# Patient Record
Sex: Female | Born: 1995 | Race: White | Hispanic: Yes | Marital: Single | Smoking: Never smoker
Health system: Southern US, Community
[De-identification: ages and names within clinical notes are randomized; demographics above are authoritative.]

## PROBLEM LIST (undated history)

## (undated) DIAGNOSIS — Z789 Other specified health status: Secondary | ICD-10-CM

## (undated) HISTORY — DX: Other specified health status: Z78.9

## (undated) HISTORY — PX: NO PAST SURGERIES: SHX2092

---

## 2019-11-04 ENCOUNTER — Encounter (HOSPITAL_BASED_OUTPATIENT_CLINIC_OR_DEPARTMENT_OTHER): Payer: Self-pay | Admitting: Emergency Medicine

## 2019-11-04 ENCOUNTER — Emergency Department (HOSPITAL_BASED_OUTPATIENT_CLINIC_OR_DEPARTMENT_OTHER)
Admission: EM | Admit: 2019-11-04 | Discharge: 2019-11-04 | Disposition: A | Discharge status: Home / Self Care | Payer: Self-pay | Attending: Emergency Medicine | Admitting: Emergency Medicine

## 2019-11-04 ENCOUNTER — Other Ambulatory Visit: Payer: Self-pay

## 2019-11-04 ENCOUNTER — Emergency Department (HOSPITAL_BASED_OUTPATIENT_CLINIC_OR_DEPARTMENT_OTHER): Payer: Self-pay

## 2019-11-04 DIAGNOSIS — R1030 Lower abdominal pain, unspecified: Secondary | ICD-10-CM | POA: Insufficient documentation

## 2019-11-04 DIAGNOSIS — B9689 Other specified bacterial agents as the cause of diseases classified elsewhere: Secondary | ICD-10-CM

## 2019-11-04 DIAGNOSIS — N939 Abnormal uterine and vaginal bleeding, unspecified: Secondary | ICD-10-CM

## 2019-11-04 DIAGNOSIS — Z113 Encounter for screening for infections with a predominantly sexual mode of transmission: Secondary | ICD-10-CM | POA: Insufficient documentation

## 2019-11-04 DIAGNOSIS — B3731 Acute candidiasis of vulva and vagina: Secondary | ICD-10-CM

## 2019-11-04 DIAGNOSIS — Z3A11 11 weeks gestation of pregnancy: Secondary | ICD-10-CM | POA: Insufficient documentation

## 2019-11-04 DIAGNOSIS — O26891 Other specified pregnancy related conditions, first trimester: Secondary | ICD-10-CM

## 2019-11-04 DIAGNOSIS — N3001 Acute cystitis with hematuria: Secondary | ICD-10-CM | POA: Insufficient documentation

## 2019-11-04 DIAGNOSIS — B373 Candidiasis of vulva and vagina: Secondary | ICD-10-CM | POA: Insufficient documentation

## 2019-11-04 DIAGNOSIS — O23591 Infection of other part of genital tract in pregnancy, first trimester: Secondary | ICD-10-CM | POA: Insufficient documentation

## 2019-11-04 LAB — CBC WITH DIFFERENTIAL/PLATELET
Abs Immature Granulocytes: 0.04 10*3/uL (ref 0.00–0.07)
Basophils Absolute: 0 10*3/uL (ref 0.0–0.1)
Basophils Relative: 0 %
Eosinophils Absolute: 0 10*3/uL (ref 0.0–0.5)
Eosinophils Relative: 0 %
HCT: 41.4 % (ref 36.0–46.0)
Hemoglobin: 14.3 g/dL (ref 12.0–15.0)
Immature Granulocytes: 0 %
Lymphocytes Relative: 26 %
Lymphs Abs: 2.5 10*3/uL (ref 0.7–4.0)
MCH: 30.6 pg (ref 26.0–34.0)
MCHC: 34.5 g/dL (ref 30.0–36.0)
MCV: 88.7 fL (ref 80.0–100.0)
Monocytes Absolute: 0.5 10*3/uL (ref 0.1–1.0)
Monocytes Relative: 5 %
Neutro Abs: 6.5 10*3/uL (ref 1.7–7.7)
Neutrophils Relative %: 69 %
Platelets: 345 10*3/uL (ref 150–400)
RBC: 4.67 MIL/uL (ref 3.87–5.11)
RDW: 13.2 % (ref 11.5–15.5)
WBC: 9.6 10*3/uL (ref 4.0–10.5)
nRBC: 0 % (ref 0.0–0.2)

## 2019-11-04 LAB — URINALYSIS, ROUTINE W REFLEX MICROSCOPIC
Bilirubin Urine: NEGATIVE
Glucose, UA: NEGATIVE mg/dL
Ketones, ur: NEGATIVE mg/dL
Nitrite: NEGATIVE
Protein, ur: NEGATIVE mg/dL
Specific Gravity, Urine: 1.025 (ref 1.005–1.030)
pH: 6 (ref 5.0–8.0)

## 2019-11-04 LAB — COMPREHENSIVE METABOLIC PANEL
ALT: 14 U/L (ref 0–44)
AST: 15 U/L (ref 15–41)
Albumin: 4.1 g/dL (ref 3.5–5.0)
Alkaline Phosphatase: 36 U/L — ABNORMAL LOW (ref 38–126)
Anion gap: 9 (ref 5–15)
BUN: 8 mg/dL (ref 6–20)
CO2: 20 mmol/L — ABNORMAL LOW (ref 22–32)
Calcium: 8.9 mg/dL (ref 8.9–10.3)
Chloride: 103 mmol/L (ref 98–111)
Creatinine, Ser: 0.53 mg/dL (ref 0.44–1.00)
GFR calc Af Amer: >60 mL/min (ref >60)
GFR calc non Af Amer: >60 mL/min (ref >60)
Glucose, Bld: 94 mg/dL (ref 70–99)
Potassium: 3.8 mmol/L (ref 3.5–5.1)
Sodium: 132 mmol/L — ABNORMAL LOW (ref 135–145)
Total Bilirubin: 0.2 mg/dL — ABNORMAL LOW (ref 0.3–1.2)
Total Protein: 7.5 g/dL (ref 6.5–8.1)

## 2019-11-04 LAB — URINALYSIS, MICROSCOPIC (REFLEX): WBC, UA: >50 WBC/hpf (ref 0–5)

## 2019-11-04 LAB — HCG, QUANTITATIVE, PREGNANCY: hCG, Beta Chain, Quant, S: 101769 m[IU]/mL — ABNORMAL HIGH (ref <5)

## 2019-11-04 LAB — WET PREP, GENITAL
Sperm: NONE SEEN
Trich, Wet Prep: NONE SEEN

## 2019-11-04 LAB — PREGNANCY, URINE: Preg Test, Ur: POSITIVE — AB

## 2019-11-04 LAB — HIV ANTIBODY (ROUTINE TESTING W REFLEX): HIV Screen 4th Generation wRfx: NONREACTIVE

## 2019-11-04 MED ORDER — SODIUM CHLORIDE 0.9 % IV SOLN
1.0000 g | Freq: Once | INTRAVENOUS | Status: AC
  Administered 2019-11-04: 1 g via INTRAVENOUS
  Filled 2019-11-04: qty 10

## 2019-11-04 MED ORDER — PRENATAL COMPLETE 14-0.4 MG PO TABS: ORAL_TABLET | ORAL | 3 refills | Status: DC

## 2019-11-04 MED ORDER — METRONIDAZOLE 500 MG PO TABS: 500.0000 mg | ORAL_TABLET | Freq: Two times a day (BID) | ORAL | 0 refills | Status: DC

## 2019-11-04 MED ORDER — SODIUM CHLORIDE 0.9 % IV BOLUS
1000.0000 mL | Freq: Once | INTRAVENOUS | Status: AC
  Administered 2019-11-04: 1000 mL via INTRAVENOUS

## 2019-11-04 MED ORDER — CEPHALEXIN 500 MG PO CAPS: 500.0000 mg | ORAL_CAPSULE | Freq: Two times a day (BID) | ORAL | 0 refills | Status: AC

## 2019-11-04 MED ORDER — AZITHROMYCIN 250 MG PO TABS
1000.0000 mg | ORAL_TABLET | Freq: Once | ORAL | Status: AC
  Administered 2019-11-04: 1000 mg via ORAL
  Filled 2019-11-04: qty 4

## 2019-11-04 NOTE — ED Notes (Signed)
Patient transported to Ultrasound 

## 2019-11-04 NOTE — Discharge Instructions (Signed)
Take the Keflex for UTI  Flagyl for Bacterial vaginosis  Obtain Monistat over the counter vaginal suppository for Yeast infection  Follow up with Obgyn

## 2019-11-04 NOTE — ED Triage Notes (Signed)
Low abd pain since Thursday. Hematuria today. Denies vaginal discharge.

## 2019-11-04 NOTE — ED Provider Notes (Signed)
[facility] EMERGENCY DEPARTMENT Provider Note   CSN: [id number] Arrival date & time: 11/04/19  1043    History Chief Complaint  Patient presents with  . Abdominal Pain    Christy Melendez is a 24 y.o. female with past medical history who presents for evaluation of lower pelvic pain and hematuria.  Patient states over the last 3 days she has had lower abdominal cramping.  She is unsure if she is about to start her menstrual cycle as she has not had a period since July.  Patient states it is common for her to go months without a menstrual cycle.  She is sexually active.  Last sexual activity September 12.  Patient states had one episode of hematuria yesterday however none today.  She is unsure if she has had vaginal bleeding.  No vaginal discharge.  She has no concerns for STDs.  No associated flank pain.  No prior history of kidney stones.  Has had some burning with urination over the last 2 days.  Denies fever, chills, nausea, chest pain, shortness of breath.  Denies moderating alleviating factors.  She has had no prior pregnancies.  She is not followed by OB/GYN.  History obtained from patient and past medical records. No interpretor was used.  HPI     History reviewed. No pertinent past medical history.  There are no problems to display for this patient.   History reviewed. No pertinent surgical history.   OB History   No obstetric history on file.     No family history on file.  Social History   Tobacco Use  . Smoking status: Never Smoker  . Smokeless tobacco: Never Used  Substance Use Topics  . Alcohol use: Yes  . Drug use: Never    Home Medications Prior to Admission medications   Medication Sig Start Date End Date Taking? Authorizing Provider  cephALEXin (KEFLEX) 500 MG capsule Take 1 capsule (500 mg total) by mouth 2 (two) times daily for 7 days. 11/04/19 11/11/19  Brylan Dec A, PA-C  metroNIDAZOLE (FLAGYL) 500 MG tablet Take 1 tablet (500 mg  total) by mouth 2 (two) times daily. 11/04/19   Keshawna Dix A, PA-C  Prenatal Vit-Fe Fumarate-FA (PRENATAL COMPLETE) 14-0.4 MG TABS 1 tablet daily 11/04/19   Foy Vanduyne A, PA-C    Allergies    Penicillins  Review of Systems   Review of Systems  Constitutional: Negative.   HENT: Negative.   Respiratory: Negative.   Cardiovascular: Negative.   Gastrointestinal: Positive for abdominal pain. Negative for abdominal distention, anal bleeding, blood in stool, constipation, diarrhea, nausea, rectal pain and vomiting.  Genitourinary: Positive for dysuria, hematuria and menstrual problem. Negative for decreased urine volume, difficulty urinating, flank pain, frequency, pelvic pain, urgency, vaginal discharge and vaginal pain.  Musculoskeletal: Negative.   Skin: Negative.   Neurological: Negative.   All other systems reviewed and are negative.   Physical Exam Updated Vital Signs BP 126/76 (BP Location: Right Arm)   Pulse 80   Temp 98.1 F (36.7 C) (Oral)   Resp 16   Ht 5\' 6"  (1.676 m)   Wt 89.8 kg   LMP 08/09/2019   SpO2 100%   BMI 31.96 kg/m   Physical Exam Vitals and nursing note reviewed.  Constitutional:      General: She is not in acute distress.    Appearance: She is well-developed. She is not ill-appearing, toxic-appearing or diaphoretic.  HENT:     Head: Normocephalic and atraumatic.  Mouth/Throat:     Mouth: Mucous membranes are moist.  Eyes:     Pupils: Pupils are equal, round, and reactive to light.  Cardiovascular:     Rate and Rhythm: Normal rate.     Heart sounds: Normal heart sounds.  Pulmonary:     Effort: Pulmonary effort is normal. No respiratory distress.     Breath sounds: Normal breath sounds.  Abdominal:     General: Bowel sounds are normal. There is no distension.     Palpations: Abdomen is soft.     Tenderness: There is abdominal tenderness in the suprapubic area. There is no right CVA tenderness, left CVA tenderness, guarding or  rebound. Negative signs include Murphy's sign and McBurney's sign.     Hernia: No hernia is present.  Genitourinary:    Comments: Normal appearing external female genitalia without rashes or lesions, normal vaginal epithelium. Normal appearing cervix with copious yellow, green discharge. No cervical petechia. Cervical os is closed. There is no bleeding noted at the os. No odor. Bimanual: No CMT, nontender.  No palpable adnexal masses or tenderness. Uterus midline and not fixed. Rectovaginal exam was deferred.  No cystocele or rectocele noted. No pelvic lymphadenopathy noted. Wet prep was obtained.  Cultures for gonorrhea and chlamydia collected. Exam performed with chaperone in room. Musculoskeletal:        General: Normal range of motion.     Cervical back: Normal range of motion.  Skin:    General: Skin is warm and dry.     Capillary Refill: Capillary refill takes less than 2 seconds.  Neurological:     General: No focal deficit present.     Mental Status: She is alert.     ED Results / Procedures / Treatments   Labs (all labs ordered are listed, but only abnormal results are displayed) Labs Reviewed  WET PREP, GENITAL - Abnormal; Notable for the following components:      Result Value   Yeast Wet Prep HPF POC PRESENT (*)    Clue Cells Wet Prep HPF POC PRESENT (*)    WBC, Wet Prep HPF POC MANY (*)    All other components within normal limits  URINALYSIS, ROUTINE W REFLEX MICROSCOPIC - Abnormal; Notable for the following components:   APPearance CLOUDY (*)    Hgb urine dipstick TRACE (*)    Leukocytes,Ua LARGE (*)    All other components within normal limits  PREGNANCY, URINE - Abnormal; Notable for the following components:   Preg Test, Ur POSITIVE (*)    All other components within normal limits  URINALYSIS, MICROSCOPIC (REFLEX) - Abnormal; Notable for the following components:   Bacteria, UA MANY (*)    All other components within normal limits  COMPREHENSIVE METABOLIC PANEL  - Abnormal; Notable for the following components:   Sodium 132 (*)    CO2 20 (*)    Alkaline Phosphatase 36 (*)    Total Bilirubin 0.2 (*)    All other components within normal limits  HCG, QUANTITATIVE, PREGNANCY - Abnormal; Notable for the following components:   hCG, Beta Chain, Quant, S 101,769 (*)    All other components within normal limits  URINE CULTURE  CBC WITH DIFFERENTIAL/PLATELET  RPR  HIV ANTIBODY (ROUTINE TESTING W REFLEX)  ABO/RH  GC/CHLAMYDIA PROBE AMP ([place]) NOT AT [facility]    EKG None  Radiology US OB LESS THAN 14 WEEKS WITH OB TRANSVAGINAL  Result Date: 11/04/2019 CLINICAL DATA:  Vaginal bleeding. EXAM: OBSTETRIC <14 WK [place] AND  TRANSVAGINAL OB US TECHNIQUE: Both transabdominal and transvaginal ultrasound examinations were performed for complete evaluation of the gestation as well as the maternal uterus, adnexal regions, and pelvic cul-de-sac. Transvaginal technique was performed to assess early pregnancy. COMPARISON:  None. FINDINGS: Intrauterine gestational sac: Single Yolk sac:  Not Visualized. Embryo:  Visualized. Cardiac Activity: Visualized. Heart Rate: 160  bpm CRL: 50.9  mm   11 w   6 d                  [place] EDC: 05/19/2020 Subchorionic hemorrhage:  None visualized. Maternal uterus/adnexae: Normal appearance. Probable corpus luteum in left ovary. No free fluid. IMPRESSION: Single viable intrauterine pregnancy. No acute abnormality identified in the pelvis. Electronically Signed   By: Emmaline Kluver M.D.   On: 11/04/2019 14:37    Procedures Procedures (including critical care time)  Medications Ordered in ED Medications  sodium chloride 0.9 % bolus 1,000 mL (0 mLs Intravenous Stopped 11/04/19 1433)  cefTRIAXone (ROCEPHIN) 1 g in sodium chloride 0.9 % 100 mL IVPB (0 g Intravenous Stopped 11/04/19 1400)  azithromycin (ZITHROMAX) tablet 1,000 mg (1,000 mg Oral Given 11/04/19 1525)    ED Course  I have reviewed the triage vital signs and the nursing  notes.  Pertinent labs & imaging results that were available during my care of the patient were reviewed by me and considered in my medical decision making (see chart for details).  24 year old presents for evaluation of suprapubic pain and hematuria.  She is afebrile, nonseptic, non-ill-appearing.  Hematuria x1 yesterday, none today.  Suprapubic pain over the last 3 days.  LMP in July.  History of abnormal menstrual cycles.  She is sexually active.  Denies vaginal discharge or concerns for STDs.  She has had no prior pregnancies.  She has not followed OB/GYN.  Has has had no dysuria.  Urine pregnancy test here positive Urinalysis consistent with UTI Plan on labs, imaging, pelvic and reassess.  [place] IUP at 11 weeks 6 days. hCG quant [id number] CBC without leukocytosis Metabolic panel with mild hyponatremia at 132, CO2 20, no additional electrolyte, renal or liver abnormality  Rh a positive-does not need RhoGam Wet prep with yeast, BV and many WBC  She is GU exam consistent with STD given green discharge.  Was treated empirically with Rocephin and azithromycin.  Was given prescription for her UTI.  Referral was placed for OB/GYN.  She did not have any bleeding from her cervix on her pelvic exam.  Her cervix was closed.  Discussed regarding her precautions.  Patient voiced understanding is agreeable for follow-up.  Patient does not meet the SIRS or Sepsis criteria.  On repeat exam patient does not have a surgical abdomin and there are no peritoneal signs.  No indication of appendicitis, bowel obstruction, bowel perforation, cholecystitis, renal stone, diverticulitis, PID, TOA or ectopic pregnancy.   Patient has not needed anything for pain while who she was here in the emergency department.  She is tolerating p.o. intake without difficulty.  The patient has been appropriately medically screened and/or stabilized in the ED. I have low suspicion for any other emergent medical condition which would  require further screening, evaluation or treatment in the ED or require inpatient management.  Patient is hemodynamically stable and in no acute distress.  Patient able to ambulate in department prior to ED.  Evaluation does not show acute pathology that would require ongoing or additional emergent interventions while in the emergency department or further inpatient treatment.  I have  discussed the diagnosis with the patient and answered all questions.  Pain is been managed while in the emergency department and patient has no further complaints prior to discharge.  Patient is comfortable with plan discussed in room and is stable for discharge at this time.  I have discussed strict return precautions for returning to the emergency department.  Patient was encouraged to follow-up with PCP/specialist refer to at discharge.     MDM Rules/Calculators/A&P                           Final Clinical Impression(s) / ED Diagnoses Final diagnoses:  Abdominal pain during pregnancy in first trimester  [redacted] weeks gestation of pregnancy  Acute cystitis with hematuria  Yeast vaginitis  Bacterial vaginosis in pregnancy  Screening for STDs (sexually transmitted diseases)    Rx / DC Orders ED Discharge Orders         Ordered    cephALEXin (KEFLEX) 500 MG capsule  2 times daily        11/04/19 1517    metroNIDAZOLE (FLAGYL) 500 MG tablet  2 times daily        11/04/19 1517    Prenatal Vit-Fe Fumarate-FA (PRENATAL COMPLETE) 14-0.4 MG TABS        11/04/19 1517    Ambulatory referral to Obstetrics / Gynecology        11/04/19 1519           Rielly Brunn A, PA-C 11/04/19 1603    Cathren LaineSteinl, Kevin, MD 11/07/19 1514

## 2019-11-05 LAB — URINE CULTURE

## 2019-11-05 LAB — ABO/RH: ABO/RH(D): A POS

## 2019-11-05 LAB — GC/CHLAMYDIA PROBE AMP (~~LOC~~) NOT AT ARMC
Chlamydia: POSITIVE — AB
Comment: NEGATIVE
Comment: NORMAL
Neisseria Gonorrhea: NEGATIVE

## 2019-11-05 LAB — RPR: RPR Ser Ql: NONREACTIVE

## 2019-11-13 ENCOUNTER — Other Ambulatory Visit: Payer: Self-pay

## 2019-11-13 ENCOUNTER — Encounter: Payer: Self-pay | Admitting: Advanced Practice Midwife

## 2019-11-13 ENCOUNTER — Other Ambulatory Visit (HOSPITAL_COMMUNITY)
Admission: RE | Admit: 2019-11-13 | Discharge: 2019-11-13 | Disposition: A | Discharge status: Home / Self Care | Payer: Self-pay | Source: Ambulatory Visit | Attending: Advanced Practice Midwife | Admitting: Advanced Practice Midwife

## 2019-11-13 ENCOUNTER — Ambulatory Visit (INDEPENDENT_AMBULATORY_CARE_PROVIDER_SITE_OTHER): Payer: Self-pay | Admitting: Advanced Practice Midwife

## 2019-11-13 VITALS — BP 124/85 | HR 105 | Wt 202.2 lb

## 2019-11-13 DIAGNOSIS — Z3480 Encounter for supervision of other normal pregnancy, unspecified trimester: Secondary | ICD-10-CM | POA: Insufficient documentation

## 2019-11-13 DIAGNOSIS — Z3A13 13 weeks gestation of pregnancy: Secondary | ICD-10-CM

## 2019-11-13 MED ORDER — AZITHROMYCIN 250 MG PO TABS: 1000.0000 mg | ORAL_TABLET | Freq: Once | ORAL | 0 refills | Status: AC

## 2019-11-13 NOTE — Progress Notes (Signed)
Need Tx for Chlamydia from 10/3

## 2019-11-13 NOTE — Progress Notes (Signed)
Initial Prenatal Visit Note    Subjective:   Christy Melendez is a 24 y.o. G1P0 at [redacted]w[redacted]d by LMP being seen today for her first obstetrical visit.  Her obstetrical history is significant for none . Patient does intend to breast feed. Pregnancy history fully reviewed.  Patient reports no complaints.  HISTORY: OB History  Gravida Para Term Preterm AB Living  1 0 0 0 0 0  SAB TAB Ectopic Multiple Live Births  0 0 0 0 0    # Outcome Date GA Lbr Len/2nd Weight Sex Delivery Anes PTL Lv  1 Current             Last pap smear was done unsure and was unknown   History reviewed. No pertinent past medical history. History reviewed. No pertinent surgical history. History reviewed. No pertinent family history. Social History   Tobacco Use  . Smoking status: Never Smoker  . Smokeless tobacco: Never Used  Substance Use Topics  . Alcohol use: Never  . Drug use: Never   Allergies  Allergen Reactions  . Penicillins    Current Outpatient Medications on File Prior to Visit  Medication Sig Dispense Refill  . Prenatal Vit-Fe Fumarate-FA (PRENATAL COMPLETE) 14-0.4 MG TABS 1 tablet daily 60 tablet 3  . metroNIDAZOLE (FLAGYL) 500 MG tablet Take 1 tablet (500 mg total) by mouth 2 (two) times daily. (Patient not taking: Reported on 11/13/2019) 14 tablet 0   No current facility-administered medications on file prior to visit.    Review of Systems Pertinent items noted in HPI and remainder of comprehensive ROS otherwise negative.  Exam   Vitals:   11/13/19 1419  BP: 124/85  Pulse: (!) 105  Weight: 202 lb 3.2 oz (91.7 kg)   Fetal Heart Rate (bpm): 147  Physical Exam Vitals and nursing note reviewed. Exam conducted with a chaperone present.  Constitutional:      General: She is not in acute distress. HENT:     Head: Normocephalic.  Cardiovascular:     Rate and Rhythm: Normal rate.  Pulmonary:     Effort: Pulmonary effort is normal.  Abdominal:     Palpations: Abdomen is soft.      Tenderness: There is no abdominal tenderness.  Genitourinary:    Comments:  External: no lesion Vagina: small amount of white discharge Cervix: pink, smooth, no CMT Uterus: 13 weeks size   Skin:    General: Skin is warm and dry.  Neurological:     Mental Status: She is alert and oriented to person, place, and time.  Psychiatric:        Mood and Affect: Mood normal.        Behavior: Behavior normal.      Assessment:   Pregnancy: G1P0 Patient Active Problem List   Diagnosis Date Noted  . Supervision of other normal pregnancy, antepartum 11/13/2019     Plan:  1. Supervision of other normal pregnancy, antepartum - [place] MFM OB DETAIL +14 WK; Future - Culture, OB Urine - Genetic Screening - CBC/D/Plt+RPR+Rh+ABO+Rub Ab... - Cytology - PAP( [facility])  2. [redacted] weeks gestation of pregnancy - patient had +chlamydia on 11/04/2019. She has not gotten treated yet. RX sent to pharmacy. Discussed partner treatment.    Initial labs drawn. Continue prenatal vitamins. Genetic Screening discussed, AFP and NIPS: requested. Ultrasound discussed; fetal anatomic survey: ordered. Problem list reviewed and updated. The nature of [facility] with multiple MDs and other Advanced Practice Providers was explained  to patient; also emphasized that residents, students are part of our team. Routine obstetric precautions reviewed. 50% of 45 min visit spent in counseling and coordination of care. No follow-ups on file.   Thressa Sheller DNP, CNM  11/13/19  2:54 PM

## 2019-11-14 LAB — CBC/D/PLT+RPR+RH+ABO+RUB AB...
Antibody Screen: NEGATIVE
Basophils Absolute: 0 10*3/uL (ref 0.0–0.2)
Basos: 0 %
EOS (ABSOLUTE): 0.1 10*3/uL (ref 0.0–0.4)
Eos: 1 %
HCV Ab: <0.1 s/co ratio (ref 0.0–0.9)
HIV Screen 4th Generation wRfx: NONREACTIVE
Hematocrit: 40.4 % (ref 34.0–46.6)
Hemoglobin: 13.8 g/dL (ref 11.1–15.9)
Hepatitis B Surface Ag: NEGATIVE
Immature Grans (Abs): 0 10*3/uL (ref 0.0–0.1)
Immature Granulocytes: 0 %
Lymphocytes Absolute: 2.7 10*3/uL (ref 0.7–3.1)
Lymphs: 28 %
MCH: 30.7 pg (ref 26.6–33.0)
MCHC: 34.2 g/dL (ref 31.5–35.7)
MCV: 90 fL (ref 79–97)
Monocytes Absolute: 0.5 10*3/uL (ref 0.1–0.9)
Monocytes: 5 %
Neutrophils Absolute: 6.3 10*3/uL (ref 1.4–7.0)
Neutrophils: 66 %
Platelets: 348 10*3/uL (ref 150–450)
RBC: 4.49 x10E6/uL (ref 3.77–5.28)
RDW: 12.6 % (ref 11.7–15.4)
RPR Ser Ql: NONREACTIVE
Rh Factor: POSITIVE
Rubella Antibodies, IGG: 6.56 index (ref >0.99)
WBC: 9.6 10*3/uL (ref 3.4–10.8)

## 2019-11-14 LAB — HCV INTERPRETATION

## 2019-11-15 LAB — CYTOLOGY - PAP
Comment: NEGATIVE
Diagnosis: NEGATIVE
Diagnosis: REACTIVE
High risk HPV: NEGATIVE

## 2019-11-19 LAB — URINE CULTURE, OB REFLEX

## 2019-11-19 LAB — CULTURE, OB URINE

## 2019-11-22 ENCOUNTER — Encounter: Payer: Self-pay | Admitting: General Practice

## 2019-11-22 MED ORDER — CEPHALEXIN 500 MG PO CAPS: 500.0000 mg | ORAL_CAPSULE | Freq: Two times a day (BID) | ORAL | 0 refills | Status: AC

## 2019-11-22 NOTE — Addendum Note (Signed)
Addended by: Thressa Sheller D on: 11/22/2019 09:17 AM   Modules accepted: Orders

## 2019-12-11 ENCOUNTER — Encounter: Payer: Self-pay | Admitting: Advanced Practice Midwife

## 2019-12-18 ENCOUNTER — Encounter: Payer: Self-pay | Admitting: Advanced Practice Midwife

## 2019-12-18 ENCOUNTER — Ambulatory Visit (INDEPENDENT_AMBULATORY_CARE_PROVIDER_SITE_OTHER): Payer: Self-pay | Admitting: Advanced Practice Midwife

## 2019-12-18 ENCOUNTER — Other Ambulatory Visit: Payer: Self-pay

## 2019-12-18 VITALS — BP 124/82 | HR 89 | Wt 200.3 lb

## 2019-12-18 DIAGNOSIS — Z3480 Encounter for supervision of other normal pregnancy, unspecified trimester: Secondary | ICD-10-CM

## 2019-12-18 DIAGNOSIS — Z3A18 18 weeks gestation of pregnancy: Secondary | ICD-10-CM

## 2019-12-18 NOTE — Patient Instructions (Signed)
COVID-19 Vaccination if You Are Pregnant or Breastfeeding ° °The Society for Maternal-Fetal Medicine (SMFM) and other pregnancy experts recommend that pregnant and lactating people be vaccinated against COVID-19. The Centers for Disease Control and Prevention (CDC) also recommend vaccination for “all people aged 26 years and older, including people who are pregnant, breastfeeding, trying to get pregnant now, or might become pregnant in the future.” Vaccination is the best way to reduce the risks of COVID-19 infection and COVID-related complications for both you and your baby. ° °Three vaccines are available to prevent COVID-19: °• The two-dose Pfizer vaccine for people 12 years and older--APPROVED by the [place] Food and Drug Administration on September 24, 2019 °• The two-dose Moderna vaccine for people 18 years and older--AUTHORIZED for emergency use °• The one-dose Johnson & Johnson vaccine for people 18 years and older (you may also see this vaccine referred to as the “Janssen vaccine”)--AUTHORIZED for emergency use ° °For those receiving the Pfizer and Moderna vaccines, the second dose is given 21 days (Pfizer) and 28 days (Moderna) after the first dose. The Johnson & Johnson vaccine is only one dose. ° °Information for Pregnant Individuals °If you are pregnant or planning to become pregnant and are thinking about getting vaccinated, consider talking with your health care professional about the vaccine.  ° °To help with your decision, you should consider the following key points: °Anyone can get the COVID vaccines free of charge regardless of immigration status or whether they have insurance. You may be asked for your social security number, but it is  °NOT required to get vaccinated. ° °What are benefits of getting the COVID-19 vaccines during pregnancy?  °• The vaccines can help protect you from getting COVID-19. With the two-dose vaccines, you must get both doses for maximum effectiveness. It’s not yet known how  long protection lasts. ° °• Another potential benefit is that getting the vaccine while pregnant may help you pass antiCOVID-19 antibodies to your baby. In numerous studies of vaccinated moms, antibodies were found in the umbilical cord blood of babies and in the mother’s breastmilk. ° °• The CDC, along with other federal partners, are monitoring people who have been vaccinated for serious side effects. So far, more than 139,000 pregnant people have been °vaccinated. No unexpected pregnancy or fetal problems have occurred. There have been no reports of any increased risk of pregnancy loss, growth problems, or birth defects. ° °• A safe vaccine is generally considered one in which the benefits of being vaccinated outweigh the risks. The current vaccines are not live vaccines. There is only a very small  °chance that they cross the placenta, so it’s unlikely that they even reach the fetus. Vaccines don’t affect future fertility. The only people who should NOT get vaccinated are those who have had a severe allergic reaction to vaccines in the past or any vaccine ingredients. ° °• Side effects may occur in the first 3 days after getting vaccinated.1 These include mild to moderate fever, headache, and muscle aches. Side effects may be worse after the second dose of the Pfizer and Moderna vaccines. Fever should be avoided during pregnancy,especially in the first trimester. Those who develop a fever after vaccination can take °acetaminophen (Tylenol). This medication is safe to use during pregnancy and does not  affect how the vaccine works.  ° °What are the known risks of getting COVID-19 during pregnancy?  °About 1 to 3 per 1,000 pregnant women with COVID-19 will develop severe disease. Compared with those who   aren’t pregnant, pregnant people infected by the COVID-19 virus: °• Are 3 times more likely to need ICU care °• Are 2 to 3 times more likely to need advanced life support and a breathing tube  °• Have a small  increased risk of dying due to COVID-19 °They may also be at increased risk of stillbirth and preterm birth. ° °What is my risk of getting COVID-19?  °Your risk of getting COVID-19 depends on the chance that you will come into contact with another infected person. The risk may be higher if you live in a community where there is a lot of COVID-19 infection or work in healthcare or another high-contact setting.  ° °What is my risk for severe complications if I get COVID-19?  °Data show that older pregnant women; those with preexisting health conditions, such as a body mass index higher than 35 kg/m2, diabetes, and heart disorders; and Black or Latinx women have an especially increased risk of severe disease and death from COVID-19. °  °If you still have questions about the vaccines or need more information, ask your health care provider or go to the Centers for Disease Control and Prevention’s COVID-19 vaccine webpage.  ° °An Update on the Johnson & Johnson Vaccine  ° °In April 2021, the FDA and CDC called for a brief pause to use of the Johnson & Johnson vaccine. They did so after reports of a severe side effect in a very small number of women younger than age 50 following vaccination. This side effect, called thrombosis with thrombocytopenia syndrome (TTS), causes blood clots (thrombosis) combined with low levels of platelets (thrombocytopenia). ° °TTS following the Johnson & Johnson vaccine is extremely rare. At the time of this update, it has occurred in only 7 people per 1 million Johnson & Johnson shots given. According to the CDC, being on hormonal birth control (the pill, patch, or ring), pregnancy, breastfeeding, or being recently pregnant does not make you more likely to develop TTS after getting the Johnson & Johnson vaccine. The pause was lifted on May 25, 2019, after the FDA and CDC determined that the known benefits of the Johnson & Johnson vaccine far outweigh the risks.  ° °Health care professionals  have been alerted to the possibility of this side effect in people who have received the Johnson & Johnson vaccine. National organizations continue to recommend COVID-19 vaccination with any of the vaccines for pregnant women. All women younger than age 50 years, whether pregnant, breastfeeding, or not, should be aware of the very rare risk of TTS after getting the Johnson & Johnson vaccine. The Pfizer and Moderna vaccines don’t have this risk. If you get the Johnson & Johnson vaccine,  °seek medical help right away if you develop any of the following symptoms within 3 weeks of getting your shot: ° °• Severe or persistent headaches or blurred vision °• Shortness of breath °• Chest pain °• Leg swelling °• Persistent abdominal pain °• Easy bruising or tiny blood spots under the skin beyond the injection site ° °Experts continue to collect health and safety information from pregnant people who have been vaccinated. If you have questions about vaccination during pregnancy, visit the CDC website or talk to your health care professional. Information for Breastfeeding/Lactating Individuals The Society for Maternal-Fetal Medicine and other pregnancy experts recommend COVID-19 vaccination for people who are breastfeeding/lactating. You don’t have to delay or stop  °breastfeeding just because you get vaccinated.  ° °Getting Vaccinated  °You can get vaccinated at   any time during pregnancy. The CDC is committed to monitoring the vaccine’s safety for all individuals. Your health professional or vaccine clinic may give you information about enrolling in the v-safe after vaccination health checker (see the box below).Even after you’re fully vaccinated, it is important to follow the CDC’s guidance for wearing a mask indoors in areas where there are substantial or high rates of COVID-19 infection.  ° °What Happens When You Enroll in v-Safe?  °The v-safe after vaccination health checker program lets the CDC check in with you after  your vaccination. At sign-up, you can indicate that you are pregnant. Once you do that, expect the following: °• Someone may call you from the v-safe program to ask initial questions and get more information. °• You may be asked to enroll in the vaccine pregnancy registry, which is collecting information about any effects of the vaccine during pregnancy. This is a great way to help scientists monitor the vaccine’s safety and effectiveness.  ° °References °1. Oliver SE, Gargano JW, Marin M, Wallace M, Curran KG, Chamberland M, et al. The Advisory Committee on Immunization Practices’ Interim Recommendation for Use of Pfizer-BioNTech  °COVID-19 Vaccine -- [place], December 2020. MMWR Morbidity and Mortality Weekly Report 2020;69. °2. FDA Briefing Document. Janssen Ad26.COV2.S Vaccine for the Prevention of COVID-19. 2021. Accessed Apr 06, 2019; Available from: https://www.fda.gov/media/146217/download °3. PFIZER-BIONTECH COVID-19 VACCINE [package insert] [place]: Pfizer and [place], [place]: Biontech;2020. °4. FDA Briefing Document. Moderna COVID-19 Vaccine. 2020. Accessed 2020, Dec 18; Available from: https://www.fda.gov/media/144434/download  °5. Gray KJ, Bordt EA, Atyeo C, Deriso E, Akinwunmi B, Young N, et al. COVID-19 vaccine response in pregnant and lactating women: a cohort study. Am J Obstet Gynecol 2021 Mar 24. °6. Panagiotakopoulos L, Myers TR, Gee J, Lipkind HS, Kharbanda EO, Ryan DS, et al. SARS-CoV-2 Infection Among Hospitalized Pregnant Women: Reasons for Admission and Pregnancy  °Characteristics - [facility], March 1-Jul 01, 2018. MMWR Morb Mortal Wkly Rep 2020 Sep 23;69(38):1355-9. °7. Zambrano LD, Ellington S, Strid P, Galang RR, Oduyebo T, Tong VT, et al. Update: Characteristics of Symptomatic Women of Reproductive Age with Laboratory-Confirmed SARSCoV-2 Infection by Pregnancy Status - [place], January 22-November 04, 2018. MMWR Morb Mortal Wkly Rep 2020 Nov  6;69(44):1641-7. °8. Delahoy MJ, Whitaker M, O'Halloran A, Chai SJ, Kirley PD, Alden N, et al. Characteristics and Maternal and Birth Outcomes of Hospitalized Pregnant Women with Laboratory-Confirmed COVID-19 - COVID-NET, 13 [place], March 1-September 23, 2018. MMWR Morb Mortal Wkly Rep 2020 Sep 25;69(38):1347-54. ° °

## 2019-12-18 NOTE — Progress Notes (Signed)
   PRENATAL VISIT NOTE  Subjective:  Christy Melendez is a 24 y.o. G1P0 at [redacted]w[redacted]d being seen today for ongoing prenatal care.  She is currently monitored for the following issues for this low-risk pregnancy and has Supervision of other normal pregnancy, antepartum on their problem list.  Patient reports no complaints.  Contractions: Not present. Vag. Bleeding: None.  Movement: Absent. Denies leaking of fluid.   The following portions of the patient's history were reviewed and updated as appropriate: allergies, current medications, past family history, past medical history, past social history, past surgical history and problem list.   Objective:   Vitals:   12/18/19 1008  BP: 124/82  Pulse: 89  Weight: 200 lb 4.8 oz (90.9 kg)    Fetal Status: Fetal Heart Rate (bpm): 154   Movement: Absent     General:  Alert, oriented and cooperative. Patient is in no acute distress.  Skin: Skin is warm and dry. No rash noted.   Cardiovascular: Normal heart rate noted  Respiratory: Normal respiratory effort, no problems with respiration noted  Abdomen: Soft, gravid, appropriate for gestational age.  Pain/Pressure: Present     Pelvic: Cervical exam deferred        Extremities: Normal range of motion.  Edema: Trace  Mental Status: Normal mood and affect. Normal behavior. Normal judgment and thought content.   Assessment and Plan:  Pregnancy: G1P0 at [redacted]w[redacted]d 1. Supervision of other normal pregnancy, antepartum - Patient had +Chamydia on 10/3. She did not end up getting treatment until a few days ago due to being out of town. (but it also looks like she possibly had empiric treatment in the ED) Patient had already left when I saw that and I did not get to ask her, but either way she needs a TOC at her next visit  - Will need TOC at next visit   2. [redacted] weeks gestation of pregnancy - AFP, Serum, Open Spina Bifida  Preterm labor symptoms and general obstetric precautions including but not limited to  vaginal bleeding, contractions, leaking of fluid and fetal movement were reviewed in detail with the patient. Please refer to After Visit Summary for other counseling recommendations.   No follow-ups on file.  Future Appointments  Date Time Provider Department Center  12/24/2019 10:45 AM [facility] US5 [facility]   Thressa Sheller DNP, CNM  12/18/19  11:12 AM

## 2019-12-20 LAB — AFP, SERUM, OPEN SPINA BIFIDA
AFP MoM: 1.27
AFP Value: 49.6 ng/mL
Gest. Age on Collection Date: 18.5 weeks
Maternal Age At EDD: 26.9 yr
OSBR Risk 1 IN: 5251
Test Results:: NEGATIVE
Weight: 200 [lb_av]

## 2019-12-24 ENCOUNTER — Other Ambulatory Visit: Payer: Self-pay

## 2019-12-24 ENCOUNTER — Other Ambulatory Visit: Payer: Self-pay | Admitting: *Deleted

## 2019-12-24 ENCOUNTER — Other Ambulatory Visit: Payer: Self-pay | Admitting: Advanced Practice Midwife

## 2019-12-24 ENCOUNTER — Ambulatory Visit: Payer: Self-pay | Attending: Advanced Practice Midwife

## 2019-12-24 DIAGNOSIS — Z3480 Encounter for supervision of other normal pregnancy, unspecified trimester: Secondary | ICD-10-CM | POA: Insufficient documentation

## 2019-12-24 DIAGNOSIS — Z362 Encounter for other antenatal screening follow-up: Secondary | ICD-10-CM

## 2020-01-16 ENCOUNTER — Ambulatory Visit (INDEPENDENT_AMBULATORY_CARE_PROVIDER_SITE_OTHER): Payer: Self-pay | Admitting: Student

## 2020-01-16 ENCOUNTER — Other Ambulatory Visit (HOSPITAL_COMMUNITY)
Admission: RE | Admit: 2020-01-16 | Discharge: 2020-01-16 | Disposition: A | Discharge status: Home / Self Care | Payer: Self-pay | Source: Ambulatory Visit | Attending: Student | Admitting: Student

## 2020-01-16 ENCOUNTER — Other Ambulatory Visit: Payer: Self-pay

## 2020-01-16 VITALS — BP 111/69 | HR 103 | Wt 197.9 lb

## 2020-01-16 DIAGNOSIS — O98819 Other maternal infectious and parasitic diseases complicating pregnancy, unspecified trimester: Secondary | ICD-10-CM | POA: Insufficient documentation

## 2020-01-16 DIAGNOSIS — B951 Streptococcus, group B, as the cause of diseases classified elsewhere: Secondary | ICD-10-CM | POA: Insufficient documentation

## 2020-01-16 DIAGNOSIS — Z3A22 22 weeks gestation of pregnancy: Secondary | ICD-10-CM

## 2020-01-16 DIAGNOSIS — O43199 Other malformation of placenta, unspecified trimester: Secondary | ICD-10-CM

## 2020-01-16 DIAGNOSIS — A749 Chlamydial infection, unspecified: Secondary | ICD-10-CM | POA: Insufficient documentation

## 2020-01-16 DIAGNOSIS — Z3480 Encounter for supervision of other normal pregnancy, unspecified trimester: Secondary | ICD-10-CM

## 2020-01-16 LAB — OB RESULTS CONSOLE GC/CHLAMYDIA: Gonorrhea: NEGATIVE

## 2020-01-16 MED ORDER — PRENATAL PLUS 27-1 MG PO TABS: 1.0000 | ORAL_TABLET | Freq: Every day | ORAL | 11 refills | Status: AC

## 2020-01-16 NOTE — Progress Notes (Signed)
   PRENATAL VISIT NOTE  Subjective:  Christy Melendez is a 24 y.o. G1P0 at [redacted]w[redacted]d being seen today for ongoing prenatal care.  She is currently monitored for the following issues for this low-risk pregnancy and has Supervision of other normal pregnancy, antepartum; Group beta Strep positive; Chlamydia; and Marginal insertion of umbilical cord affecting management of mother on their problem list.  Patient reports no complaints. She has questions about her [place] and what marginal cord insertion means.  Contractions: Not present. Vag. Bleeding: None.  Movement: Absent. Denies leaking of fluid.   The following portions of the patient's history were reviewed and updated as appropriate: allergies, current medications, past family history, past medical history, past social history, past surgical history and problem list.   Objective:   Vitals:   01/16/20 1017  BP: 111/69  Pulse: (!) 103  Weight: 197 lb 14.4 oz (89.8 kg)    Fetal Status: Fetal Heart Rate (bpm): 154 Fundal Height: 23 cm Movement: Absent     General:  Alert, oriented and cooperative. Patient is in no acute distress.  Skin: Skin is warm and dry. No rash noted.   Cardiovascular: Normal heart rate noted  Respiratory: Normal respiratory effort, no problems with respiration noted  Abdomen: Soft, gravid, appropriate for gestational age.  Pain/Pressure: Absent     Pelvic: Cervical exam deferred        Extremities: Normal range of motion.  Edema: None  Mental Status: Normal mood and affect. Normal behavior. Normal judgment and thought content.   Assessment and Plan:  Pregnancy: G1P0 at [redacted]w[redacted]d 1. Supervision of other normal pregnancy, antepartum -answered questions about [place] and findings, including implications of marginal cord insertion - prenatal vitamin w/FE, FA (PRENATAL 1 + 1) 27-1 MG TABS tablet; Take 1 tablet by mouth daily at 12 noon.  Dispense: 30 tablet; Refill: 11 - Cervicovaginal ancillary only  2. Chlamydia infection during  pregnancy -will do test of cure today; her partner has been treated - Cervicovaginal ancillary only  3. Group beta Strep positive  Preterm labor symptoms and general obstetric precautions including but not limited to vaginal bleeding, contractions, leaking of fluid and fetal movement were reviewed in detail with the patient. Please refer to After Visit Summary for other counseling recommendations.   Return in about 3 weeks (around 02/06/2020), or LROB in person.  Future Appointments  Date Time Provider Department Center  01/21/2020  2:30 PM [facility] NURSE [facility]  01/21/2020  2:45 PM [facility] US5 [facility]  02/05/2020  2:55 PM Briant Sites [facility]    Marylene Land, [place]

## 2020-01-17 LAB — CERVICOVAGINAL ANCILLARY ONLY
Chlamydia: NEGATIVE
Comment: NEGATIVE
Comment: NEGATIVE
Comment: NORMAL
Neisseria Gonorrhea: NEGATIVE
Trichomonas: NEGATIVE

## 2020-01-21 ENCOUNTER — Other Ambulatory Visit: Payer: Self-pay

## 2020-01-21 ENCOUNTER — Ambulatory Visit: Payer: Medicaid Other | Attending: Obstetrics and Gynecology

## 2020-01-21 ENCOUNTER — Encounter: Payer: Self-pay | Admitting: *Deleted

## 2020-01-21 ENCOUNTER — Ambulatory Visit: Payer: Medicaid Other | Admitting: *Deleted

## 2020-01-21 DIAGNOSIS — Z3480 Encounter for supervision of other normal pregnancy, unspecified trimester: Secondary | ICD-10-CM

## 2020-01-21 DIAGNOSIS — Z3A23 23 weeks gestation of pregnancy: Secondary | ICD-10-CM

## 2020-01-21 DIAGNOSIS — B951 Streptococcus, group B, as the cause of diseases classified elsewhere: Secondary | ICD-10-CM | POA: Insufficient documentation

## 2020-01-21 DIAGNOSIS — O43192 Other malformation of placenta, second trimester: Secondary | ICD-10-CM

## 2020-01-21 DIAGNOSIS — Z362 Encounter for other antenatal screening follow-up: Secondary | ICD-10-CM | POA: Insufficient documentation

## 2020-01-21 DIAGNOSIS — E669 Obesity, unspecified: Secondary | ICD-10-CM

## 2020-01-21 DIAGNOSIS — O99212 Obesity complicating pregnancy, second trimester: Secondary | ICD-10-CM

## 2020-01-22 ENCOUNTER — Other Ambulatory Visit: Payer: Self-pay | Admitting: *Deleted

## 2020-01-22 DIAGNOSIS — O43199 Other malformation of placenta, unspecified trimester: Secondary | ICD-10-CM

## 2020-02-02 NOTE — L&D Delivery Note (Signed)
Delivery Note Called to bedside and patient complete and pushing. At 12:17 AM a viable female was delivered via Vaginal, Spontaneous (Presentation:   Occiput Anterior).  Loose nuchal and arm cord noted at delivery, delivered through without difficulty. APGAR:8,8; weight 3020g.   Placenta status: Spontaneous;Pathology, Intact.  Cord: 3 vessels with the following complications: marginal cord insertion, placenta sent to pathology. TXA given after delivery given continued oozing from lac repair sites.  Anesthesia: Epidural Episiotomy: None Lacerations: Left Sulcus Suture Repair: 3.0 vicryl Est. Blood Loss (mL): 509  Mom to postpartum.  Baby to Couplet care / Skin to Skin.  Alric Seton 05/11/2020, 12:53 AM

## 2020-02-05 ENCOUNTER — Ambulatory Visit (INDEPENDENT_AMBULATORY_CARE_PROVIDER_SITE_OTHER): Payer: Medicaid Other | Admitting: Advanced Practice Midwife

## 2020-02-05 ENCOUNTER — Other Ambulatory Visit: Payer: Self-pay

## 2020-02-05 VITALS — BP 118/77 | HR 79 | Wt 203.7 lb

## 2020-02-05 DIAGNOSIS — O43199 Other malformation of placenta, unspecified trimester: Secondary | ICD-10-CM

## 2020-02-05 DIAGNOSIS — Z3A25 25 weeks gestation of pregnancy: Secondary | ICD-10-CM

## 2020-02-05 DIAGNOSIS — Z3492 Encounter for supervision of normal pregnancy, unspecified, second trimester: Secondary | ICD-10-CM

## 2020-02-05 DIAGNOSIS — Z3480 Encounter for supervision of other normal pregnancy, unspecified trimester: Secondary | ICD-10-CM

## 2020-02-05 DIAGNOSIS — Z3482 Encounter for supervision of other normal pregnancy, second trimester: Secondary | ICD-10-CM

## 2020-02-05 NOTE — Progress Notes (Signed)
   PRENATAL VISIT NOTE  Subjective:  Christy Melendez is a 25 y.o. G1P0 at [redacted]w[redacted]d being seen today for ongoing prenatal care.  She is currently monitored for the following issues for this low-risk pregnancy and has Supervision of other normal pregnancy, antepartum; Group beta Strep positive; Chlamydia; and Marginal insertion of umbilical cord affecting management of mother on their problem list.  Patient reports that she has never fetal movement. She has friends whose babies can be seen moving from far away and they report feeling fetal movement from a very early gestation.  Contractions: Not present. Vag. Bleeding: None.  Movement: Absent. Denies leaking of fluid.   The following portions of the patient's history were reviewed and updated as appropriate: allergies, current medications, past family history, past medical history, past social history, past surgical history and problem list. Problem list updated.  Objective:   Vitals:   02/05/20 1518  BP: 118/77  Pulse: 79  Weight: 203 lb 11.2 oz (92.4 kg)    Fetal Status: Fetal Heart Rate (bpm): 152   Movement: Absent     General:  Alert, oriented and cooperative. Patient is in no acute distress.  Skin: Skin is warm and dry. No rash noted.   Cardiovascular: Normal heart rate noted  Respiratory: Normal respiratory effort, no problems with respiration noted  Abdomen: Soft, gravid, appropriate for gestational age.  Pain/Pressure: Absent     Pelvic: Cervical exam deferred        Extremities: Normal range of motion.  Edema: Trace  Mental Status: Normal mood and affect. Normal behavior. Normal judgment and thought content.   Assessment and Plan:  Pregnancy: G1P0 at [redacted]w[redacted]d  1. Supervision of other normal pregnancy, antepartum - LOB, fasting 2 hour GTT next visit - Discussed next steps in event of GDM diagnosis  2. [redacted] weeks gestation of pregnancy   3. Marginal insertion of umbilical cord affecting management of mother - Serial MFM  scans  4. Movement of fetus present during pregnancy in second trimester - Discussed expectations for tracking kick counts starting at 28-30 weeks, interventions for not meeting kick counts - Palpable fetal movement, helped patient apply hands to abdomen, identified fetal movement, patient verbalized understanding   Preterm labor symptoms and general obstetric precautions including but not limited to vaginal bleeding, contractions, leaking of fluid and fetal movement were reviewed in detail with the patient. Please refer to After Visit Summary for other counseling recommendations.  Return in about 3 weeks (around 02/26/2020).  Future Appointments  Date Time Provider Department Center  02/25/2020  3:00 PM [facility]URSE [facility]  02/25/2020  3:15 PM [facility] US2 [facility]  02/29/2020  8:20 AM [facility]  02/29/2020  8:55 AM Crissie Reese, Mary Sella, MD [facility]    Calvert Cantor, CNM

## 2020-02-05 NOTE — Patient Instructions (Signed)
Glucose Tolerance Test During Pregnancy Why am I having this test? The glucose tolerance test (GTT) is done to check how your body processes sugar (glucose). This is one of several tests used to diagnose diabetes that develops during pregnancy (gestational diabetes mellitus). Gestational diabetes is a temporary form of diabetes that some women develop during pregnancy. It usually occurs during the second trimester of pregnancy and goes away after delivery. Testing (screening) for gestational diabetes usually occurs between 24 and 28 weeks of pregnancy. You may have the GTT test after having a 1-hour glucose screening test if the results from that test indicate that you may have gestational diabetes. You may also have this test if:  You have a history of gestational diabetes.  You have a history of giving birth to very large babies or have experienced repeated fetal loss (stillbirth).  You have signs and symptoms of diabetes, such as: ? Changes in your vision. ? Tingling or numbness in your hands or feet. ? Changes in hunger, thirst, and urination that are not otherwise explained by your pregnancy. What is being tested? This test measures the amount of glucose in your blood at different times during a period of 3 hours. This indicates how well your body is able to process glucose. What kind of sample is taken?  Blood samples are required for this test. They are usually collected by inserting a needle into a blood vessel. How do I prepare for this test?  For 3 days before your test, eat normally. Have plenty of carbohydrate-rich foods.  Follow instructions from your health care provider about: ? Eating or drinking restrictions on the day of the test. You may be asked to not eat or drink anything other than water (fast) starting 8-10 hours before the test. ? Changing or stopping your regular medicines. Some medicines may interfere with this test. Tell a health care provider about:  All  medicines you are taking, including vitamins, herbs, eye drops, creams, and over-the-counter medicines.  Any blood disorders you have.  Any surgeries you have had.  Any medical conditions you have. What happens during the test? First, your blood glucose will be measured. This is referred to as your fasting blood glucose, since you fasted before the test. Then, you will drink a glucose solution that contains a certain amount of glucose. Your blood glucose will be measured again 1, 2, and 3 hours after drinking the solution. This test takes about 3 hours to complete. You will need to stay at the testing location during this time. During the testing period:  Do not eat or drink anything other than the glucose solution.  Do not exercise.  Do not use any products that contain nicotine or tobacco, such as cigarettes and e-cigarettes. If you need help stopping, ask your health care provider. The testing procedure may vary among health care providers and hospitals. How are the results reported? Your results will be reported as milligrams of glucose per deciliter of blood (mg/dL) or millimoles per liter (mmol/L). Your health care provider will compare your results to normal ranges that were established after testing a large group of people (reference ranges). Reference ranges may vary among labs and hospitals. For this test, common reference ranges are:  Fasting: less than 95-105 mg/dL (5.3-5.8 mmol/L).  1 hour after drinking glucose: less than 180-190 mg/dL (10.0-10.5 mmol/L).  2 hours after drinking glucose: less than 155-165 mg/dL (8.6-9.2 mmol/L).  3 hours after drinking glucose: 140-145 mg/dL (7.8-8.1 mmol/L). What do the   results mean? Results within reference ranges are considered normal, meaning that your glucose levels are well-controlled. If two or more of your blood glucose levels are high, you may be diagnosed with gestational diabetes. If only one level is high, your health care  provider may suggest repeat testing or other tests to confirm a diagnosis. Talk with your health care provider about what your results mean. Questions to ask your health care provider Ask your health care provider, or the department that is doing the test:  When will my results be ready?  How will I get my results?  What are my treatment options?  What other tests do I need?  What are my next steps? Summary  The glucose tolerance test (GTT) is one of several tests used to diagnose diabetes that develops during pregnancy (gestational diabetes mellitus). Gestational diabetes is a temporary form of diabetes that some women develop during pregnancy.  You may have the GTT test after having a 1-hour glucose screening test if the results from that test indicate that you may have gestational diabetes. You may also have this test if you have any symptoms or risk factors for gestational diabetes.  Talk with your health care provider about what your results mean. This information is not intended to replace advice given to you by your health care provider. Make sure you discuss any questions you have with your health care provider. Document Revised: 05/11/2018 Document Reviewed: 08/30/2016 [employer] Patient Education  2020 [employer]  

## 2020-02-07 ENCOUNTER — Encounter: Payer: Self-pay | Admitting: *Deleted

## 2020-02-25 ENCOUNTER — Encounter: Payer: Self-pay | Admitting: *Deleted

## 2020-02-25 ENCOUNTER — Ambulatory Visit: Payer: Medicaid Other | Admitting: *Deleted

## 2020-02-25 ENCOUNTER — Ambulatory Visit: Payer: Medicaid Other | Attending: Obstetrics and Gynecology

## 2020-02-25 ENCOUNTER — Other Ambulatory Visit: Payer: Self-pay

## 2020-02-25 DIAGNOSIS — Z3A28 28 weeks gestation of pregnancy: Secondary | ICD-10-CM | POA: Diagnosis not present

## 2020-02-25 DIAGNOSIS — Z3480 Encounter for supervision of other normal pregnancy, unspecified trimester: Secondary | ICD-10-CM | POA: Diagnosis present

## 2020-02-25 DIAGNOSIS — B951 Streptococcus, group B, as the cause of diseases classified elsewhere: Secondary | ICD-10-CM | POA: Insufficient documentation

## 2020-02-25 DIAGNOSIS — Z362 Encounter for other antenatal screening follow-up: Secondary | ICD-10-CM

## 2020-02-25 DIAGNOSIS — O43199 Other malformation of placenta, unspecified trimester: Secondary | ICD-10-CM | POA: Diagnosis present

## 2020-02-25 DIAGNOSIS — O43193 Other malformation of placenta, third trimester: Secondary | ICD-10-CM

## 2020-02-25 DIAGNOSIS — O99213 Obesity complicating pregnancy, third trimester: Secondary | ICD-10-CM | POA: Diagnosis not present

## 2020-02-25 DIAGNOSIS — E669 Obesity, unspecified: Secondary | ICD-10-CM

## 2020-02-26 ENCOUNTER — Other Ambulatory Visit: Payer: Self-pay | Admitting: *Deleted

## 2020-02-26 ENCOUNTER — Encounter: Payer: Medicaid Other | Admitting: Family Medicine

## 2020-02-26 DIAGNOSIS — O43199 Other malformation of placenta, unspecified trimester: Secondary | ICD-10-CM

## 2020-02-29 ENCOUNTER — Other Ambulatory Visit: Payer: Self-pay

## 2020-02-29 ENCOUNTER — Ambulatory Visit (INDEPENDENT_AMBULATORY_CARE_PROVIDER_SITE_OTHER): Payer: Medicaid Other | Admitting: Family Medicine

## 2020-02-29 ENCOUNTER — Encounter: Payer: Self-pay | Admitting: Family Medicine

## 2020-02-29 ENCOUNTER — Other Ambulatory Visit: Payer: Medicaid Other

## 2020-02-29 VITALS — BP 114/70 | HR 100 | Wt 202.9 lb

## 2020-02-29 DIAGNOSIS — Z23 Encounter for immunization: Secondary | ICD-10-CM

## 2020-02-29 DIAGNOSIS — Z3480 Encounter for supervision of other normal pregnancy, unspecified trimester: Secondary | ICD-10-CM

## 2020-02-29 DIAGNOSIS — O43199 Other malformation of placenta, unspecified trimester: Secondary | ICD-10-CM

## 2020-02-29 DIAGNOSIS — A749 Chlamydial infection, unspecified: Secondary | ICD-10-CM

## 2020-02-29 DIAGNOSIS — B951 Streptococcus, group B, as the cause of diseases classified elsewhere: Secondary | ICD-10-CM

## 2020-02-29 NOTE — Patient Instructions (Signed)
 Contraception Choices Contraception, also called birth control, refers to methods or devices that prevent pregnancy. Hormonal methods Contraceptive implant A contraceptive implant is a thin, plastic tube that contains a hormone that prevents pregnancy. It is different from an intrauterine device (IUD). It is inserted into the upper part of the arm by a health care provider. Implants can be effective for up to 3 years. Progestin-only injections Progestin-only injections are injections of progestin, a synthetic form of the hormone progesterone. They are given every 3 months by a health care provider. Birth control pills Birth control pills are pills that contain hormones that prevent pregnancy. They must be taken once a day, preferably at the same time each day. A prescription is needed to use this method of contraception. Birth control patch The birth control patch contains hormones that prevent pregnancy. It is placed on the skin and must be changed once a week for three weeks and removed on the fourth week. A prescription is needed to use this method of contraception. Vaginal ring A vaginal ring contains hormones that prevent pregnancy. It is placed in the vagina for three weeks and removed on the fourth week. After that, the process is repeated with a new ring. A prescription is needed to use this method of contraception. Emergency contraceptive Emergency contraceptives prevent pregnancy after unprotected sex. They come in pill form and can be taken up to 5 days after sex. They work best the sooner they are taken after having sex. Most emergency contraceptives are available without a prescription. This method should not be used as your only form of birth control.   Barrier methods Female condom A female condom is a thin sheath that is worn over the penis during sex. Condoms keep sperm from going inside a woman's body. They can be used with a sperm-killing substance (spermicide) to increase their  effectiveness. They should be thrown away after one use. Female condom A female condom is a soft, loose-fitting sheath that is put into the vagina before sex. The condom keeps sperm from going inside a woman's body. They should be thrown away after one use. Diaphragm A diaphragm is a soft, dome-shaped barrier. It is inserted into the vagina before sex, along with a spermicide. The diaphragm blocks sperm from entering the uterus, and the spermicide kills sperm. A diaphragm should be left in the vagina for 6-8 hours after sex and removed within 24 hours. A diaphragm is prescribed and fitted by a health care provider. A diaphragm should be replaced every 1-2 years, after giving birth, after gaining more than 15 lb (6.8 kg), and after pelvic surgery. Cervical cap A cervical cap is a round, soft latex or plastic cup that fits over the cervix. It is inserted into the vagina before sex, along with spermicide. It blocks sperm from entering the uterus. The cap should be left in place for 6-8 hours after sex and removed within 48 hours. A cervical cap must be prescribed and fitted by a health care provider. It should be replaced every 2 years. Sponge A sponge is a soft, circular piece of polyurethane foam with spermicide in it. The sponge helps block sperm from entering the uterus, and the spermicide kills sperm. To use it, you make it wet and then insert it into the vagina. It should be inserted before sex, left in for at least 6 hours after sex, and removed and thrown away within 30 hours. Spermicides Spermicides are chemicals that kill or block sperm from entering the   cervix and uterus. They can come as a cream, jelly, suppository, foam, or tablet. A spermicide should be inserted into the vagina with an applicator at least 10-15 minutes before sex to allow time for it to work. The process must be repeated every time you have sex. Spermicides do not require a prescription.   Intrauterine  contraception Intrauterine device (IUD) An IUD is a T-shaped device that is put in a woman's uterus. There are two types:  Hormone IUD.This type contains progestin, a synthetic form of the hormone progesterone. This type can stay in place for 3-5 years.  Copper IUD.This type is wrapped in copper wire. It can stay in place for 10 years. Permanent methods of contraception Female tubal ligation In this method, a woman's fallopian tubes are sealed, tied, or blocked during surgery to prevent eggs from traveling to the uterus. Hysteroscopic sterilization In this method, a small, flexible insert is placed into each fallopian tube. The inserts cause scar tissue to form in the fallopian tubes and block them, so sperm cannot reach an egg. The procedure takes about 3 months to be effective. Another form of birth control must be used during those 3 months. Female sterilization This is a procedure to tie off the tubes that carry sperm (vasectomy). After the procedure, the man can still ejaculate fluid (semen). Another form of birth control must be used for 3 months after the procedure. Natural planning methods Natural family planning In this method, a couple does not have sex on days when the woman could become pregnant. Calendar method In this method, the woman keeps track of the length of each menstrual cycle, identifies the days when pregnancy can happen, and does not have sex on those days. Ovulation method In this method, a couple avoids sex during ovulation. Symptothermal method This method involves not having sex during ovulation. The woman typically checks for ovulation by watching changes in her temperature and in the consistency of cervical mucus. Post-ovulation method In this method, a couple waits to have sex until after ovulation. Where to find more information  Centers for Disease Control and Prevention: www.cdc.gov Summary  Contraception, also called birth control, refers to methods or  devices that prevent pregnancy.  Hormonal methods of contraception include implants, injections, pills, patches, vaginal rings, and emergency contraceptives.  Barrier methods of contraception can include female condoms, female condoms, diaphragms, cervical caps, sponges, and spermicides.  There are two types of IUDs (intrauterine devices). An IUD can be put in a woman's uterus to prevent pregnancy for 3-5 years.  Permanent sterilization can be done through a procedure for males and females. Natural family planning methods involve nothaving sex on days when the woman could become pregnant. This information is not intended to replace advice given to you by your health care provider. Make sure you discuss any questions you have with your health care provider. Document Revised: 06/25/2019 Document Reviewed: 06/25/2019 Elsevier Patient Education  2021 [employer] Inc.   Breastfeeding  Choosing to breastfeed is one of the best decisions you can make for yourself and your baby. A change in hormones during pregnancy causes your breasts to make breast milk in your milk-producing glands. Hormones prevent breast milk from being released before your baby is born. They also prompt milk flow after birth. Once breastfeeding has begun, thoughts of your baby, as well as his or her sucking or crying, can stimulate the release of milk from your milk-producing glands. Benefits of breastfeeding Research shows that breastfeeding offers many health benefits   for infants and mothers. It also offers a cost-free and convenient way to feed your baby. For your baby  Your first milk (colostrum) helps your baby's digestive system to function better.  Special cells in your milk (antibodies) help your baby to fight off infections.  Breastfed babies are less likely to develop asthma, allergies, obesity, or type 2 diabetes. They are also at lower risk for sudden infant death syndrome (SIDS).  Nutrients in breast milk are better  able to meet your baby's needs compared to infant formula.  Breast milk improves your baby's brain development. For you  Breastfeeding helps to create a very special bond between you and your baby.  Breastfeeding is convenient. Breast milk costs nothing and is always available at the correct temperature.  Breastfeeding helps to burn calories. It helps you to lose the weight that you gained during pregnancy.  Breastfeeding makes your uterus return faster to its size before pregnancy. It also slows bleeding (lochia) after you give birth.  Breastfeeding helps to lower your risk of developing type 2 diabetes, osteoporosis, rheumatoid arthritis, cardiovascular disease, and breast, ovarian, uterine, and endometrial cancer later in life. Breastfeeding basics Starting breastfeeding  Find a comfortable place to sit or lie down, with your neck and back well-supported.  Place a pillow or a rolled-up blanket under your baby to bring him or her to the level of your breast (if you are seated). Nursing pillows are specially designed to help support your arms and your baby while you breastfeed.  Make sure that your baby's tummy (abdomen) is facing your abdomen.  Gently massage your breast. With your fingertips, massage from the outer edges of your breast inward toward the nipple. This encourages milk flow. If your milk flows slowly, you may need to continue this action during the feeding.  Support your breast with 4 fingers underneath and your thumb above your nipple (make the letter "C" with your hand). Make sure your fingers are well away from your nipple and your baby's mouth.  Stroke your baby's lips gently with your finger or nipple.  When your baby's mouth is open wide enough, quickly bring your baby to your breast, placing your entire nipple and as much of the areola as possible into your baby's mouth. The areola is the colored area around your nipple. ? More areola should be visible above your  baby's upper lip than below the lower lip. ? Your baby's lips should be opened and extended outward (flanged) to ensure an adequate, comfortable latch. ? Your baby's tongue should be between his or her lower gum and your breast.  Make sure that your baby's mouth is correctly positioned around your nipple (latched). Your baby's lips should create a seal on your breast and be turned out (everted).  It is common for your baby to suck about 2-3 minutes in order to start the flow of breast milk. Latching Teaching your baby how to latch onto your breast properly is very important. An improper latch can cause nipple pain, decreased milk supply, and poor weight gain in your baby. Also, if your baby is not latched onto your nipple properly, he or she may swallow some air during feeding. This can make your baby fussy. Burping your baby when you switch breasts during the feeding can help to get rid of the air. However, teaching your baby to latch on properly is still the best way to prevent fussiness from swallowing air while breastfeeding. Signs that your baby has successfully latched onto   your nipple  Silent tugging or silent sucking, without causing you pain. Infant's lips should be extended outward (flanged).  Swallowing heard between every 3-4 sucks once your milk has started to flow (after your let-down milk reflex occurs).  Muscle movement above and in front of his or her ears while sucking. Signs that your baby has not successfully latched onto your nipple  Sucking sounds or smacking sounds from your baby while breastfeeding.  Nipple pain. If you think your baby has not latched on correctly, slip your finger into the corner of your baby's mouth to break the suction and place it between your baby's gums. Attempt to start breastfeeding again. Signs of successful breastfeeding Signs from your baby  Your baby will gradually decrease the number of sucks or will completely stop sucking.  Your baby  will fall asleep.  Your baby's body will relax.  Your baby will retain a small amount of milk in his or her mouth.  Your baby will let go of your breast by himself or herself. Signs from you  Breasts that have increased in firmness, weight, and size 1-3 hours after feeding.  Breasts that are softer immediately after breastfeeding.  Increased milk volume, as well as a change in milk consistency and color by the fifth day of breastfeeding.  Nipples that are not sore, cracked, or bleeding. Signs that your baby is getting enough milk  Wetting at least 1-2 diapers during the first 24 hours after birth.  Wetting at least 5-6 diapers every 24 hours for the first week after birth. The urine should be clear or pale yellow by the age of 5 days.  Wetting 6-8 diapers every 24 hours as your baby continues to grow and develop.  At least 3 stools in a 24-hour period by the age of 5 days. The stool should be soft and yellow.  At least 3 stools in a 24-hour period by the age of 7 days. The stool should be seedy and yellow.  No loss of weight greater than 10% of birth weight during the first 3 days of life.  Average weight gain of 4-7 oz (113-198 g) per week after the age of 4 days.  Consistent daily weight gain by the age of 5 days, without weight loss after the age of 2 weeks. After a feeding, your baby may spit up a small amount of milk. This is normal. Breastfeeding frequency and duration Frequent feeding will help you make more milk and can prevent sore nipples and extremely full breasts (breast engorgement). Breastfeed when you feel the need to reduce the fullness of your breasts or when your baby shows signs of hunger. This is called "breastfeeding on demand." Signs that your baby is hungry include:  Increased alertness, activity, or restlessness.  Movement of the head from side to side.  Opening of the mouth when the corner of the mouth or cheek is stroked (rooting).  Increased  sucking sounds, smacking lips, cooing, sighing, or squeaking.  Hand-to-mouth movements and sucking on fingers or hands.  Fussing or crying. Avoid introducing a pacifier to your baby in the first 4-6 weeks after your baby is born. After this time, you may choose to use a pacifier. Research has shown that pacifier use during the first year of a baby's life decreases the risk of sudden infant death syndrome (SIDS). Allow your baby to feed on each breast as long as he or she wants. When your baby unlatches or falls asleep while feeding from the   first breast, offer the second breast. Because newborns are often sleepy in the first few weeks of life, you may need to awaken your baby to get him or her to feed. Breastfeeding times will vary from baby to baby. However, the following rules can serve as a guide to help you make sure that your baby is properly fed:  Newborns (babies 4 weeks of age or younger) may breastfeed every 1-3 hours.  Newborns should not go without breastfeeding for longer than 3 hours during the day or 5 hours during the night.  You should breastfeed your baby a minimum of 8 times in a 24-hour period. Breast milk pumping Pumping and storing breast milk allows you to make sure that your baby is exclusively fed your breast milk, even at times when you are unable to breastfeed. This is especially important if you go back to work while you are still breastfeeding, or if you are not able to be present during feedings. Your lactation consultant can help you find a method of pumping that works best for you and give you guidelines about how long it is safe to store breast milk.      Caring for your breasts while you breastfeed Nipples can become dry, cracked, and sore while breastfeeding. The following recommendations can help keep your breasts moisturized and healthy:  Avoid using soap on your nipples.  Wear a supportive bra designed especially for nursing. Avoid wearing underwire-style  bras or extremely tight bras (sports bras).  Air-dry your nipples for 3-4 minutes after each feeding.  Use only cotton bra pads to absorb leaked breast milk. Leaking of breast milk between feedings is normal.  Use lanolin on your nipples after breastfeeding. Lanolin helps to maintain your skin's normal moisture barrier. Pure lanolin is not harmful (not toxic) to your baby. You may also hand express a few drops of breast milk and gently massage that milk into your nipples and allow the milk to air-dry. In the first few weeks after giving birth, some women experience breast engorgement. Engorgement can make your breasts feel heavy, warm, and tender to the touch. Engorgement peaks within 3-5 days after you give birth. The following recommendations can help to ease engorgement:  Completely empty your breasts while breastfeeding or pumping. You may want to start by applying warm, moist heat (in the shower or with warm, water-soaked hand towels) just before feeding or pumping. This increases circulation and helps the milk flow. If your baby does not completely empty your breasts while breastfeeding, pump any extra milk after he or she is finished.  Apply ice packs to your breasts immediately after breastfeeding or pumping, unless this is too uncomfortable for you. To do this: ? Put ice in a plastic bag. ? Place a towel between your skin and the bag. ? Leave the ice on for 20 minutes, 2-3 times a day.  Make sure that your baby is latched on and positioned properly while breastfeeding. If engorgement persists after 48 hours of following these recommendations, contact your health care provider or a lactation consultant. Overall health care recommendations while breastfeeding  Eat 3 healthy meals and 3 snacks every day. Well-nourished mothers who are breastfeeding need an additional 450-500 calories a day. You can meet this requirement by increasing the amount of a balanced diet that you eat.  Drink  enough water to keep your urine pale yellow or clear.  Rest often, relax, and continue to take your prenatal vitamins to prevent fatigue, stress, and low   vitamin and mineral levels in your body (nutrient deficiencies).  Do not use any products that contain nicotine or tobacco, such as cigarettes and e-cigarettes. Your baby may be harmed by chemicals from cigarettes that pass into breast milk and exposure to secondhand smoke. If you need help quitting, ask your health care provider.  Avoid alcohol.  Do not use illegal drugs or marijuana.  Talk with your health care provider before taking any medicines. These include over-the-counter and prescription medicines as well as vitamins and herbal supplements. Some medicines that may be harmful to your baby can pass through breast milk.  It is possible to become pregnant while breastfeeding. If birth control is desired, ask your health care provider about options that will be safe while breastfeeding your baby. Where to find more information: [employer]: www.llli.org Contact a health care provider if:  You feel like you want to stop breastfeeding or have become frustrated with breastfeeding.  Your nipples are cracked or bleeding.  Your breasts are red, tender, or warm.  You have: ? Painful breasts or nipples. ? A swollen area on either breast. ? A fever or chills. ? Nausea or vomiting. ? Drainage other than breast milk from your nipples.  Your breasts do not become full before feedings by the fifth day after you give birth.  You feel sad and depressed.  Your baby is: ? Too sleepy to eat well. ? Having trouble sleeping. ? More than 1 week old and wetting fewer than 6 diapers in a 24-hour period. ? Not gaining weight by 5 days of age.  Your baby has fewer than 3 stools in a 24-hour period.  Your baby's skin or the white parts of his or her eyes become yellow. Get help right away if:  Your baby is overly tired  (lethargic) and does not want to wake up and feed.  Your baby develops an unexplained fever. Summary  Breastfeeding offers many health benefits for infant and mothers.  Try to breastfeed your infant when he or she shows early signs of hunger.  Gently tickle or stroke your baby's lips with your finger or nipple to allow the baby to open his or her mouth. Bring the baby to your breast. Make sure that much of the areola is in your baby's mouth. Offer one side and burp the baby before you offer the other side.  Talk with your health care provider or lactation consultant if you have questions or you face problems as you breastfeed. This information is not intended to replace advice given to you by your health care provider. Make sure you discuss any questions you have with your health care provider. Document Revised: 04/14/2017 Document Reviewed: 02/20/2016 Elsevier Patient Education  2021 Elsevier Inc.  

## 2020-02-29 NOTE — Progress Notes (Signed)
   Subjective:  Christy Melendez is a 25 y.o. G1P0 at [redacted]w[redacted]d being seen today for ongoing prenatal care.  She is currently monitored for the following issues for this low-risk pregnancy and has Supervision of other normal pregnancy, antepartum; Group beta Strep positive; Chlamydia; and Marginal insertion of umbilical cord affecting management of mother on their problem list.  Patient reports no complaints.  Contractions: Not present. Vag. Bleeding: None.  Movement: Present. Denies leaking of fluid.   The following portions of the patient's history were reviewed and updated as appropriate: allergies, current medications, past family history, past medical history, past social history, past surgical history and problem list. Problem list updated.  Objective:   Vitals:   02/29/20 0918  BP: 114/70  Pulse: 100  Weight: 202 lb 14.4 oz (92 kg)    Fetal Status: Fetal Heart Rate (bpm): 138   Movement: Present     General:  Alert, oriented and cooperative. Patient is in no acute distress.  Skin: Skin is warm and dry. No rash noted.   Cardiovascular: Normal heart rate noted  Respiratory: Normal respiratory effort, no problems with respiration noted  Abdomen: Soft, gravid, appropriate for gestational age. Pain/Pressure: Absent     Pelvic: Vag. Bleeding: None     Cervical exam deferred        Extremities: Normal range of motion.  Edema: Trace  Mental Status: Normal mood and affect. Normal behavior. Normal judgment and thought content.   Urinalysis:      Assessment and Plan:  Pregnancy: G1P0 at [redacted]w[redacted]d  1. Supervision of other normal pregnancy, antepartum BP and FHR normal 28wk labs today TDaP today  2. Marginal insertion of umbilical cord affecting management of mother Serial growths w MFM, most recent EFW borderline at 14% Repeat scheduled for next month  3. Chlamydia TOC neg  4. Group beta Strep positive ppx in labor  Preterm labor symptoms and general obstetric precautions including  but not limited to vaginal bleeding, contractions, leaking of fluid and fetal movement were reviewed in detail with the patient. Please refer to After Visit Summary for other counseling recommendations.  Return in 2 weeks (on 03/14/2020).   Venora Maples, MD

## 2020-02-29 NOTE — Addendum Note (Signed)
Addended by: Faythe Casa on: 02/29/2020 12:12 PM   Modules accepted: Orders

## 2020-03-01 LAB — HIV ANTIBODY (ROUTINE TESTING W REFLEX): HIV Screen 4th Generation wRfx: NONREACTIVE

## 2020-03-01 LAB — RPR: RPR Ser Ql: NONREACTIVE

## 2020-03-01 LAB — GLUCOSE TOLERANCE, 2 HOURS W/ 1HR
Glucose, 1 hour: 129 mg/dL (ref 65–179)
Glucose, 2 hour: 114 mg/dL (ref 65–152)
Glucose, Fasting: 82 mg/dL (ref 65–91)

## 2020-03-01 LAB — CBC
Hematocrit: 36.8 % (ref 34.0–46.6)
Hemoglobin: 12.6 g/dL (ref 11.1–15.9)
MCH: 29.5 pg (ref 26.6–33.0)
MCHC: 34.2 g/dL (ref 31.5–35.7)
MCV: 86 fL (ref 79–97)
Platelets: 303 10*3/uL (ref 150–450)
RBC: 4.27 x10E6/uL (ref 3.77–5.28)
RDW: 13 % (ref 11.7–15.4)
WBC: 11.3 10*3/uL — ABNORMAL HIGH (ref 3.4–10.8)

## 2020-03-14 ENCOUNTER — Telehealth (INDEPENDENT_AMBULATORY_CARE_PROVIDER_SITE_OTHER): Payer: Medicaid Other | Admitting: Family Medicine

## 2020-03-14 ENCOUNTER — Encounter: Payer: Self-pay | Admitting: Family Medicine

## 2020-03-14 DIAGNOSIS — Z3480 Encounter for supervision of other normal pregnancy, unspecified trimester: Secondary | ICD-10-CM

## 2020-03-14 DIAGNOSIS — Z3A30 30 weeks gestation of pregnancy: Secondary | ICD-10-CM | POA: Diagnosis not present

## 2020-03-14 DIAGNOSIS — O99820 Streptococcus B carrier state complicating pregnancy: Secondary | ICD-10-CM | POA: Diagnosis not present

## 2020-03-14 DIAGNOSIS — B951 Streptococcus, group B, as the cause of diseases classified elsewhere: Secondary | ICD-10-CM

## 2020-03-14 DIAGNOSIS — O43199 Other malformation of placenta, unspecified trimester: Secondary | ICD-10-CM

## 2020-03-14 NOTE — Patient Instructions (Signed)
 Contraception Choices Contraception, also called birth control, refers to methods or devices that prevent pregnancy. Hormonal methods Contraceptive implant A contraceptive implant is a thin, plastic tube that contains a hormone that prevents pregnancy. It is different from an intrauterine device (IUD). It is inserted into the upper part of the arm by a health care provider. Implants can be effective for up to 3 years. Progestin-only injections Progestin-only injections are injections of progestin, a synthetic form of the hormone progesterone. They are given every 3 months by a health care provider. Birth control pills Birth control pills are pills that contain hormones that prevent pregnancy. They must be taken once a day, preferably at the same time each day. A prescription is needed to use this method of contraception. Birth control patch The birth control patch contains hormones that prevent pregnancy. It is placed on the skin and must be changed once a week for three weeks and removed on the fourth week. A prescription is needed to use this method of contraception. Vaginal ring A vaginal ring contains hormones that prevent pregnancy. It is placed in the vagina for three weeks and removed on the fourth week. After that, the process is repeated with a new ring. A prescription is needed to use this method of contraception. Emergency contraceptive Emergency contraceptives prevent pregnancy after unprotected sex. They come in pill form and can be taken up to 5 days after sex. They work best the sooner they are taken after having sex. Most emergency contraceptives are available without a prescription. This method should not be used as your only form of birth control.   Barrier methods Female condom A female condom is a thin sheath that is worn over the penis during sex. Condoms keep sperm from going inside a woman's body. They can be used with a sperm-killing substance (spermicide) to increase their  effectiveness. They should be thrown away after one use. Female condom A female condom is a soft, loose-fitting sheath that is put into the vagina before sex. The condom keeps sperm from going inside a woman's body. They should be thrown away after one use. Diaphragm A diaphragm is a soft, dome-shaped barrier. It is inserted into the vagina before sex, along with a spermicide. The diaphragm blocks sperm from entering the uterus, and the spermicide kills sperm. A diaphragm should be left in the vagina for 6-8 hours after sex and removed within 24 hours. A diaphragm is prescribed and fitted by a health care provider. A diaphragm should be replaced every 1-2 years, after giving birth, after gaining more than 15 lb (6.8 kg), and after pelvic surgery. Cervical cap A cervical cap is a round, soft latex or plastic cup that fits over the cervix. It is inserted into the vagina before sex, along with spermicide. It blocks sperm from entering the uterus. The cap should be left in place for 6-8 hours after sex and removed within 48 hours. A cervical cap must be prescribed and fitted by a health care provider. It should be replaced every 2 years. Sponge A sponge is a soft, circular piece of polyurethane foam with spermicide in it. The sponge helps block sperm from entering the uterus, and the spermicide kills sperm. To use it, you make it wet and then insert it into the vagina. It should be inserted before sex, left in for at least 6 hours after sex, and removed and thrown away within 30 hours. Spermicides Spermicides are chemicals that kill or block sperm from entering the   cervix and uterus. They can come as a cream, jelly, suppository, foam, or tablet. A spermicide should be inserted into the vagina with an applicator at least 10-15 minutes before sex to allow time for it to work. The process must be repeated every time you have sex. Spermicides do not require a prescription.   Intrauterine  contraception Intrauterine device (IUD) An IUD is a T-shaped device that is put in a woman's uterus. There are two types:  Hormone IUD.This type contains progestin, a synthetic form of the hormone progesterone. This type can stay in place for 3-5 years.  Copper IUD.This type is wrapped in copper wire. It can stay in place for 10 years. Permanent methods of contraception Female tubal ligation In this method, a woman's fallopian tubes are sealed, tied, or blocked during surgery to prevent eggs from traveling to the uterus. Hysteroscopic sterilization In this method, a small, flexible insert is placed into each fallopian tube. The inserts cause scar tissue to form in the fallopian tubes and block them, so sperm cannot reach an egg. The procedure takes about 3 months to be effective. Another form of birth control must be used during those 3 months. Female sterilization This is a procedure to tie off the tubes that carry sperm (vasectomy). After the procedure, the man can still ejaculate fluid (semen). Another form of birth control must be used for 3 months after the procedure. Natural planning methods Natural family planning In this method, a couple does not have sex on days when the woman could become pregnant. Calendar method In this method, the woman keeps track of the length of each menstrual cycle, identifies the days when pregnancy can happen, and does not have sex on those days. Ovulation method In this method, a couple avoids sex during ovulation. Symptothermal method This method involves not having sex during ovulation. The woman typically checks for ovulation by watching changes in her temperature and in the consistency of cervical mucus. Post-ovulation method In this method, a couple waits to have sex until after ovulation. Where to find more information  Centers for Disease Control and Prevention: www.cdc.gov Summary  Contraception, also called birth control, refers to methods or  devices that prevent pregnancy.  Hormonal methods of contraception include implants, injections, pills, patches, vaginal rings, and emergency contraceptives.  Barrier methods of contraception can include female condoms, female condoms, diaphragms, cervical caps, sponges, and spermicides.  There are two types of IUDs (intrauterine devices). An IUD can be put in a woman's uterus to prevent pregnancy for 3-5 years.  Permanent sterilization can be done through a procedure for males and females. Natural family planning methods involve nothaving sex on days when the woman could become pregnant. This information is not intended to replace advice given to you by your health care provider. Make sure you discuss any questions you have with your health care provider. Document Revised: 06/25/2019 Document Reviewed: 06/25/2019 Elsevier Patient Education  2021 [employer] Inc.   Breastfeeding  Choosing to breastfeed is one of the best decisions you can make for yourself and your baby. A change in hormones during pregnancy causes your breasts to make breast milk in your milk-producing glands. Hormones prevent breast milk from being released before your baby is born. They also prompt milk flow after birth. Once breastfeeding has begun, thoughts of your baby, as well as his or her sucking or crying, can stimulate the release of milk from your milk-producing glands. Benefits of breastfeeding Research shows that breastfeeding offers many health benefits   for infants and mothers. It also offers a cost-free and convenient way to feed your baby. For your baby  Your first milk (colostrum) helps your baby's digestive system to function better.  Special cells in your milk (antibodies) help your baby to fight off infections.  Breastfed babies are less likely to develop asthma, allergies, obesity, or type 2 diabetes. They are also at lower risk for sudden infant death syndrome (SIDS).  Nutrients in breast milk are better  able to meet your baby's needs compared to infant formula.  Breast milk improves your baby's brain development. For you  Breastfeeding helps to create a very special bond between you and your baby.  Breastfeeding is convenient. Breast milk costs nothing and is always available at the correct temperature.  Breastfeeding helps to burn calories. It helps you to lose the weight that you gained during pregnancy.  Breastfeeding makes your uterus return faster to its size before pregnancy. It also slows bleeding (lochia) after you give birth.  Breastfeeding helps to lower your risk of developing type 2 diabetes, osteoporosis, rheumatoid arthritis, cardiovascular disease, and breast, ovarian, uterine, and endometrial cancer later in life. Breastfeeding basics Starting breastfeeding  Find a comfortable place to sit or lie down, with your neck and back well-supported.  Place a pillow or a rolled-up blanket under your baby to bring him or her to the level of your breast (if you are seated). Nursing pillows are specially designed to help support your arms and your baby while you breastfeed.  Make sure that your baby's tummy (abdomen) is facing your abdomen.  Gently massage your breast. With your fingertips, massage from the outer edges of your breast inward toward the nipple. This encourages milk flow. If your milk flows slowly, you may need to continue this action during the feeding.  Support your breast with 4 fingers underneath and your thumb above your nipple (make the letter "C" with your hand). Make sure your fingers are well away from your nipple and your baby's mouth.  Stroke your baby's lips gently with your finger or nipple.  When your baby's mouth is open wide enough, quickly bring your baby to your breast, placing your entire nipple and as much of the areola as possible into your baby's mouth. The areola is the colored area around your nipple. ? More areola should be visible above your  baby's upper lip than below the lower lip. ? Your baby's lips should be opened and extended outward (flanged) to ensure an adequate, comfortable latch. ? Your baby's tongue should be between his or her lower gum and your breast.  Make sure that your baby's mouth is correctly positioned around your nipple (latched). Your baby's lips should create a seal on your breast and be turned out (everted).  It is common for your baby to suck about 2-3 minutes in order to start the flow of breast milk. Latching Teaching your baby how to latch onto your breast properly is very important. An improper latch can cause nipple pain, decreased milk supply, and poor weight gain in your baby. Also, if your baby is not latched onto your nipple properly, he or she may swallow some air during feeding. This can make your baby fussy. Burping your baby when you switch breasts during the feeding can help to get rid of the air. However, teaching your baby to latch on properly is still the best way to prevent fussiness from swallowing air while breastfeeding. Signs that your baby has successfully latched onto   your nipple  Silent tugging or silent sucking, without causing you pain. Infant's lips should be extended outward (flanged).  Swallowing heard between every 3-4 sucks once your milk has started to flow (after your let-down milk reflex occurs).  Muscle movement above and in front of his or her ears while sucking. Signs that your baby has not successfully latched onto your nipple  Sucking sounds or smacking sounds from your baby while breastfeeding.  Nipple pain. If you think your baby has not latched on correctly, slip your finger into the corner of your baby's mouth to break the suction and place it between your baby's gums. Attempt to start breastfeeding again. Signs of successful breastfeeding Signs from your baby  Your baby will gradually decrease the number of sucks or will completely stop sucking.  Your baby  will fall asleep.  Your baby's body will relax.  Your baby will retain a small amount of milk in his or her mouth.  Your baby will let go of your breast by himself or herself. Signs from you  Breasts that have increased in firmness, weight, and size 1-3 hours after feeding.  Breasts that are softer immediately after breastfeeding.  Increased milk volume, as well as a change in milk consistency and color by the fifth day of breastfeeding.  Nipples that are not sore, cracked, or bleeding. Signs that your baby is getting enough milk  Wetting at least 1-2 diapers during the first 24 hours after birth.  Wetting at least 5-6 diapers every 24 hours for the first week after birth. The urine should be clear or pale yellow by the age of 5 days.  Wetting 6-8 diapers every 24 hours as your baby continues to grow and develop.  At least 3 stools in a 24-hour period by the age of 5 days. The stool should be soft and yellow.  At least 3 stools in a 24-hour period by the age of 7 days. The stool should be seedy and yellow.  No loss of weight greater than 10% of birth weight during the first 3 days of life.  Average weight gain of 4-7 oz (113-198 g) per week after the age of 4 days.  Consistent daily weight gain by the age of 5 days, without weight loss after the age of 2 weeks. After a feeding, your baby may spit up a small amount of milk. This is normal. Breastfeeding frequency and duration Frequent feeding will help you make more milk and can prevent sore nipples and extremely full breasts (breast engorgement). Breastfeed when you feel the need to reduce the fullness of your breasts or when your baby shows signs of hunger. This is called "breastfeeding on demand." Signs that your baby is hungry include:  Increased alertness, activity, or restlessness.  Movement of the head from side to side.  Opening of the mouth when the corner of the mouth or cheek is stroked (rooting).  Increased  sucking sounds, smacking lips, cooing, sighing, or squeaking.  Hand-to-mouth movements and sucking on fingers or hands.  Fussing or crying. Avoid introducing a pacifier to your baby in the first 4-6 weeks after your baby is born. After this time, you may choose to use a pacifier. Research has shown that pacifier use during the first year of a baby's life decreases the risk of sudden infant death syndrome (SIDS). Allow your baby to feed on each breast as long as he or she wants. When your baby unlatches or falls asleep while feeding from the   first breast, offer the second breast. Because newborns are often sleepy in the first few weeks of life, you may need to awaken your baby to get him or her to feed. Breastfeeding times will vary from baby to baby. However, the following rules can serve as a guide to help you make sure that your baby is properly fed:  Newborns (babies 4 weeks of age or younger) may breastfeed every 1-3 hours.  Newborns should not go without breastfeeding for longer than 3 hours during the day or 5 hours during the night.  You should breastfeed your baby a minimum of 8 times in a 24-hour period. Breast milk pumping Pumping and storing breast milk allows you to make sure that your baby is exclusively fed your breast milk, even at times when you are unable to breastfeed. This is especially important if you go back to work while you are still breastfeeding, or if you are not able to be present during feedings. Your lactation consultant can help you find a method of pumping that works best for you and give you guidelines about how long it is safe to store breast milk.      Caring for your breasts while you breastfeed Nipples can become dry, cracked, and sore while breastfeeding. The following recommendations can help keep your breasts moisturized and healthy:  Avoid using soap on your nipples.  Wear a supportive bra designed especially for nursing. Avoid wearing underwire-style  bras or extremely tight bras (sports bras).  Air-dry your nipples for 3-4 minutes after each feeding.  Use only cotton bra pads to absorb leaked breast milk. Leaking of breast milk between feedings is normal.  Use lanolin on your nipples after breastfeeding. Lanolin helps to maintain your skin's normal moisture barrier. Pure lanolin is not harmful (not toxic) to your baby. You may also hand express a few drops of breast milk and gently massage that milk into your nipples and allow the milk to air-dry. In the first few weeks after giving birth, some women experience breast engorgement. Engorgement can make your breasts feel heavy, warm, and tender to the touch. Engorgement peaks within 3-5 days after you give birth. The following recommendations can help to ease engorgement:  Completely empty your breasts while breastfeeding or pumping. You may want to start by applying warm, moist heat (in the shower or with warm, water-soaked hand towels) just before feeding or pumping. This increases circulation and helps the milk flow. If your baby does not completely empty your breasts while breastfeeding, pump any extra milk after he or she is finished.  Apply ice packs to your breasts immediately after breastfeeding or pumping, unless this is too uncomfortable for you. To do this: ? Put ice in a plastic bag. ? Place a towel between your skin and the bag. ? Leave the ice on for 20 minutes, 2-3 times a day.  Make sure that your baby is latched on and positioned properly while breastfeeding. If engorgement persists after 48 hours of following these recommendations, contact your health care provider or a lactation consultant. Overall health care recommendations while breastfeeding  Eat 3 healthy meals and 3 snacks every day. Well-nourished mothers who are breastfeeding need an additional 450-500 calories a day. You can meet this requirement by increasing the amount of a balanced diet that you eat.  Drink  enough water to keep your urine pale yellow or clear.  Rest often, relax, and continue to take your prenatal vitamins to prevent fatigue, stress, and low   vitamin and mineral levels in your body (nutrient deficiencies).  Do not use any products that contain nicotine or tobacco, such as cigarettes and e-cigarettes. Your baby may be harmed by chemicals from cigarettes that pass into breast milk and exposure to secondhand smoke. If you need help quitting, ask your health care provider.  Avoid alcohol.  Do not use illegal drugs or marijuana.  Talk with your health care provider before taking any medicines. These include over-the-counter and prescription medicines as well as vitamins and herbal supplements. Some medicines that may be harmful to your baby can pass through breast milk.  It is possible to become pregnant while breastfeeding. If birth control is desired, ask your health care provider about options that will be safe while breastfeeding your baby. Where to find more information: [employer]: www.llli.org Contact a health care provider if:  You feel like you want to stop breastfeeding or have become frustrated with breastfeeding.  Your nipples are cracked or bleeding.  Your breasts are red, tender, or warm.  You have: ? Painful breasts or nipples. ? A swollen area on either breast. ? A fever or chills. ? Nausea or vomiting. ? Drainage other than breast milk from your nipples.  Your breasts do not become full before feedings by the fifth day after you give birth.  You feel sad and depressed.  Your baby is: ? Too sleepy to eat well. ? Having trouble sleeping. ? More than 1 week old and wetting fewer than 6 diapers in a 24-hour period. ? Not gaining weight by 5 days of age.  Your baby has fewer than 3 stools in a 24-hour period.  Your baby's skin or the white parts of his or her eyes become yellow. Get help right away if:  Your baby is overly tired  (lethargic) and does not want to wake up and feed.  Your baby develops an unexplained fever. Summary  Breastfeeding offers many health benefits for infant and mothers.  Try to breastfeed your infant when he or she shows early signs of hunger.  Gently tickle or stroke your baby's lips with your finger or nipple to allow the baby to open his or her mouth. Bring the baby to your breast. Make sure that much of the areola is in your baby's mouth. Offer one side and burp the baby before you offer the other side.  Talk with your health care provider or lactation consultant if you have questions or you face problems as you breastfeed. This information is not intended to replace advice given to you by your health care provider. Make sure you discuss any questions you have with your health care provider. Document Revised: 04/14/2017 Document Reviewed: 02/20/2016 Elsevier Patient Education  2021 Elsevier Inc.  

## 2020-03-14 NOTE — Progress Notes (Signed)
    TELEHEALTH OBSTETRICS VISIT ENCOUNTER NOTE  Provider location: Center for [employer] [facility] [employer] [facility]   I connected with Christy Melendez on 03/14/20 at  8:55 AM EST by telephone at home and verified that I am speaking with the correct person using two identifiers. Of note, unable to do video encounter due to technical difficulties.    I discussed the limitations, risks, security and privacy concerns of performing an evaluation and management service by telephone and the availability of in person appointments. I also discussed with the patient that there may be a patient responsible charge related to this service. The patient expressed understanding and agreed to proceed.  Subjective:  Christy Melendez is a 25 y.o. G1P0 at [redacted]w[redacted]d being followed for ongoing prenatal care.  She is currently monitored for the following issues for this low-risk pregnancy and has Supervision of other normal pregnancy, antepartum; Group beta Strep positive; Chlamydia; and Marginal insertion of umbilical cord affecting management of mother on their problem list.  Patient reports no complaints. Reports fetal movement. Denies any contractions, bleeding or leaking of fluid.   The following portions of the patient's history were reviewed and updated as appropriate: allergies, current medications, past family history, past medical history, past social history, past surgical history and problem list.   Objective:  Last menstrual period 08/09/2019. General:  Alert, oriented and cooperative.   Mental Status: Normal mood and affect perceived. Normal judgment and thought content.  Rest of physical exam deferred due to type of encounter  Assessment and Plan:  Pregnancy: G1P0 at [redacted]w[redacted]d 1. Group beta Strep positive UCx positive   2. Supervision of other normal pregnancy, antepartum Good fetal movement No BP cuff at home Discussed post placental IUD insertion, she will consider  3. Marginal insertion of  umbilical cord affecting management of mother Following w MFM for growth scans  Preterm labor symptoms and general obstetric precautions including but not limited to vaginal bleeding, contractions, leaking of fluid and fetal movement were reviewed in detail with the patient.  I discussed the assessment and treatment plan with the patient. The patient was provided an opportunity to ask questions and all were answered. The patient agreed with the plan and demonstrated an understanding of the instructions. The patient was advised to call back or seek an in-person office evaluation/go to [facility] at [facility] for any urgent or concerning symptoms. Please refer to After Visit Summary for other counseling recommendations.   I provided 11 minutes of non-face-to-face time during this encounter.  Return in 2 weeks (on 03/28/2020).  Future Appointments  Date Time Provider Department Center  03/25/2020  7:45 AM [facility] NURSE [facility]  03/25/2020  8:00 AM [facility] US1 [facility]    Venora Maples, MD Center for [facility], [facility]

## 2020-03-14 NOTE — Progress Notes (Signed)
I connected with  Christy Melendez on 03/14/20 at  8:55 AM EST by telephone and verified that I am speaking with the correct person using two identifiers.   I discussed the limitations, risks, security and privacy concerns of performing an evaluation and management service by telephone and the availability of in person appointments. I also discussed with the patient that there may be a patient responsible charge related to this service. The patient expressed understanding and agreed to proceed.  [name], RN 03/14/2020  9:18 AM

## 2020-03-25 ENCOUNTER — Encounter: Payer: Self-pay | Admitting: *Deleted

## 2020-03-25 ENCOUNTER — Other Ambulatory Visit: Payer: Self-pay | Admitting: *Deleted

## 2020-03-25 ENCOUNTER — Ambulatory Visit: Payer: Medicaid Other | Attending: Obstetrics

## 2020-03-25 ENCOUNTER — Other Ambulatory Visit: Payer: Self-pay

## 2020-03-25 ENCOUNTER — Ambulatory Visit: Payer: Medicaid Other | Admitting: *Deleted

## 2020-03-25 DIAGNOSIS — O43199 Other malformation of placenta, unspecified trimester: Secondary | ICD-10-CM | POA: Diagnosis present

## 2020-03-25 DIAGNOSIS — O43193 Other malformation of placenta, third trimester: Secondary | ICD-10-CM

## 2020-03-25 DIAGNOSIS — B951 Streptococcus, group B, as the cause of diseases classified elsewhere: Secondary | ICD-10-CM

## 2020-03-25 DIAGNOSIS — Z3A32 32 weeks gestation of pregnancy: Secondary | ICD-10-CM

## 2020-03-25 DIAGNOSIS — O99213 Obesity complicating pregnancy, third trimester: Secondary | ICD-10-CM

## 2020-03-25 DIAGNOSIS — Z3480 Encounter for supervision of other normal pregnancy, unspecified trimester: Secondary | ICD-10-CM | POA: Insufficient documentation

## 2020-03-25 DIAGNOSIS — E669 Obesity, unspecified: Secondary | ICD-10-CM

## 2020-03-31 ENCOUNTER — Other Ambulatory Visit: Payer: Self-pay

## 2020-03-31 ENCOUNTER — Ambulatory Visit (INDEPENDENT_AMBULATORY_CARE_PROVIDER_SITE_OTHER): Payer: Medicaid Other | Admitting: Student

## 2020-03-31 VITALS — BP 117/78 | HR 103 | Wt 207.7 lb

## 2020-03-31 DIAGNOSIS — Z3A33 33 weeks gestation of pregnancy: Secondary | ICD-10-CM

## 2020-03-31 DIAGNOSIS — Z880 Allergy status to penicillin: Secondary | ICD-10-CM

## 2020-03-31 DIAGNOSIS — Z3480 Encounter for supervision of other normal pregnancy, unspecified trimester: Secondary | ICD-10-CM

## 2020-03-31 NOTE — Patient Instructions (Addendum)
AREA PEDIATRIC/FAMILY PRACTICE PHYSICIANS  [place] ([address]) . [facility] o Chambliss, MD; Eniola, MD; Hale, MD; Hensel, MD; McDiarmid, MD; McIntyer, MD; Neal, MD; Walden, MD o [address] [place], [place] [address] o [phone] o Mon-Fri 8:30-12:30, 1:30-5:00 o Providers come to see babies at [facility] o Accepting Medicaid . [facility] o Limited providers who accept newborns: Koirala, MD; Morrow, MD; Wolters, MD o [address], [place], [place] [address] o [phone] o Mon-Fri 8:00-5:30 o Babies seen by providers at [facility] o Does NOT accept Medicaid o Please call early in hospitalization for appointment (limited availability)  . [facility] o Mulberry, MD o [address] [place], [place] [address] o [phone] o Mon, Tue, Thur, Fri 8:30-5:00, Wed 10:00-7:00 (closed 1-2pm) o Babies seen by [facility] providers o Accepting Medicaid . [facility] - Pediatrician o Rubin, MD o [address], [place], [place] [address] o [phone] o Mon-Fri 8:30-5:00, Sat 8:30-12:00 o Provider comes to see babies at [facility] o Accepting Medicaid o Must have been referred from current patients or contacted office prior to delivery . [facility]) o Brown, MD; Chandler, MD; Ettefagh, MD; Grant, MD; Lester, MD; McCormick, MD; McQueen, MD; Prose, MD; Simha, MD; Stanley, MD; Stryffeler, NP; Tebben, NP o [address], [place], [place] [address] o [phone] o Mon, Tue, Thur, Fri 8:30-5:30, Wed 9:30-5:30, Sat 8:30-12:30 o Babies seen by [facility] providers o Accepting Medicaid o Only accepting infants of first-time parents or siblings of current patients o Hospital discharge coordinator will make follow-up appointment . Jack Amos o [address],  [place], [place]  [address] o [phone]   Fax - [phone] . [facility] o [address], [place], [place]  [address] o Phone - [phone]   Fax - [phone] . Shilpa Gosrani o [address], [place], [place]  [address] o [phone]  [place] ([address]) . [facility] o Bates, MD; [place], MD; Cooper, Cox, MD; MD; Davis, MD; Dovico, MD; Ettefaugh, MD; Little, MD; Lowe, MD; Keiffer, MD; Melvin, MD; Sumner, MD; Williams, MD o [address], [place], [place] [address] o [phone] o Mon-Fri 8:30-5:00 (extended evenings Mon-Thur as needed), Sat-Sun 10:00-1:00 o Providers come to see babies at [facility] o Accepting Medicaid for families of first-time babies and families with all children in the household age 3 and under. Must register with office prior to making appointment (M-F only). . [facility] o Henson, NP; Knapp, MD; Lalonde, MD; Tysinger, PA o [address] [place], [place] [address] o [phone] o Mon-Fri 8:00-5:00 o Babies seen by providers at [facility] o Does NOT accept Medicaid/Commercial Insurance Only . [facility] [place] ([facility])  o Artis, MD; Barnes, MD; Bratton, MD; Coccaro, MD; Lockett Gardner, MD; Kramer, MD; Marshall, MD; Netherton, MD; Poleto, MD; Skinner, MD o [address] [place], [place] [address] o [phone] o Mon-Fri 8:30-5:30, Sat (Oct.-Mar.) 9:00-1:00 o Babies seen by providers at [facility] o Accepting Medicaid  [place] ([address]) . [facility] o Reid, MD; Warner, MD o [address], [place],  [address] o [phone] o Mon-Fri 8:30-5:00, Sat 8:30-12:00 o Providers come to see babies at [facility] o Does NOT accept Medicaid . [facility] o Becker, PA; Hagler, MD; Scifres, PA; Sun, MD; Swayne, MD o [address],  [place], [place] [address] o [phone] o Mon-Fri 8:00-5:00 o Babies seen by providers at [facility] o Does NOT accept Medicaid o Only accepting babies of parents who are patients o Please call early in hospitalization for appointment (limited availability) . [facility] o Clark, MD; Frye, MD; Kelleher, MD; Mack, NP; Miller, MD; O'Keller, MD; Patterson, NP; Pudlo, MD; Puzio, MD; Thomas, MD; Tucker, MD; Twiselton, MD o [address], [place], [place] [address] o [phone] o Mon-Fri 8:00-5:00, Sat 9:00-12:00 o Providers come to see babies at [facility] o Does NOT accept Medicaid  [place] ([address]) . [facility] o Limited providers accepting new patients: Brake, NP; Wharton, PA o [address], [place], [place] [address] o [phone] o Mon-Fri 8:00-5:00 o Babies seen by providers at [facility] o Does NOT accept Medicaid o Only accepting babies of parents who are patients o Please call early in hospitalization for appointment (limited availability) . [facility] o Gay, MD; Quinlan, MD o [address] [place], [place] [address] o [phone] (press 1 to schedule appointment) o Mon-Fri 8:00-5:00 o Providers come to see babies at [facility] o Does NOT accept Medicaid . [facility] o Mazer, MD o [address] [place], [place] [address] o [phone] o Mon-Fri 8:30-5:00 (lunch 12:30-1:00), extended hours by appointment only Wed 5:00-6:30 o Babies seen by [facility] providers o Accepting Medicaid . [facility] o Banks, MD; Jordan, MD; Koberlein, MD o [address], [place], [place] [address] o [phone] o Mon-Fri 8:00-5:00 o Babies seen by [facility] providers o Does NOT accept Medicaid . [facility] o Parker, MD; Hunter, MD; Wallace, DO o [address] [place], [place]  [address] o [phone] o Mon-Fri 8:00-5:00 o Babies seen by [facility] providers o Does NOT accept Medicaid . [facility] o Brandon, PA; Brecken, PA; Christy, NP; Dees, MD; DeClaire, MD; DeWeese, MD; Hansen, NP; Mills, NP; Parrish, NP; Smoot, NP; Summer, MD; Vapne, MD o [address] [place], [place] [address] o [phone] o Mon-Fri 8:30-5:00, Sat 10:00-1:00 o Providers come to see babies at [facility] o Does NOT accept Medicaid o Free prenatal information session Tuesdays at 4:45pm . [facility] o Bouska, MD; Gordon, PA; Jeffery, PA; Weber, PA o [address] [place] [address] o [phone] o Mon-Fri 7:30-5:30 o Babies seen by [facility] providers . [facility] o [address], [place], [place], [place]  [address] o [phone]   Fax - [phone]  [place] ([phone]) . [facility] o Reese, MD o [address] [place], [place] [address] o [phone] o Mon-Thur 8:00-6:00 o Providers come to see babies at [facility] o Accepting Medicaid . [facility] o Anderson, NP; Badger, MD; Beal, PA; Spencer, PA o [address] [place],  [address] o [phone] o Mon-Thur 7:30-7:30, Fri 7:30-4:30 o Babies seen by [facility] providers o Accepting Medicaid . [facility] o Agbuya, MD; Klett, NP; Romgoolam, MD o [address], ,  [address] o [phone] o Mon-Fri 8:30-5:00, Sat 8:30-12:00 o Providers come to see babies at [facility] o Accepting Medicaid o Must have "Meet & Greet" appointment at office prior to delivery . [facility]  ([facility] ) o McCord,   MD; Juleen China, MD; Clydene Laming, [address], [place], [place] [address] o [phone] o Mon-Wed 8:00-6:00, Thur-Fri 8:00-5:00, Sat 9:00-12:00 o Providers come to  see babies at [facility] o Does NOT accept Medicaid o Only accepting siblings of current patients . [facility] [place], [place], [place], [place]  [address] o [phone]   Fax [phone] . [facility] [place] [address], [place], [place]  [address] o [phone]   Fax - Morton Gorman [phone] & [phone]) . Therapist, music at [place], DO; [place], [address] [place], [place] [address] o [phone] o Mon-Fri 7:00-5:00 o Babies seen by [facility] providers o Does NOT accept Medicaid . [place], MD; [place], [place]; [place], [place], [place], [place] [address] o [phone] o Mon-Fri 8:00-5:00 o Babies seen by [facility] providers o Accepting Medicaid . Lamont, MD; [place], [place]; [place], NP; [place], [place], [place], [place] [address] o [phone] o Mon-Fri 8:00-5:00 o Babies seen by providers at [facility] [place] [phone]) . [facility] [place], [place] o [address] [place], [place] [address] o [phone] o Mon-Fri 8:00-5:00 o Babies seen by [facility] providers o Does NOT accept Medicaid o Limited availability, please call early in hospitalization to schedule follow-up . [facility] Leilani Merl, PA; Maisie Fus, MD; [place], MD; [place], [place]; Jeannine Kitten, MD; [place], [place] [facility] [address], [place], [place] [address] o [phone] o Mon-Fri 8:30-5:00, Sat 9:00-12:00 o Babies seen by providers at [facility] o Accepting Medicaid o Please register online then schedule online or call office o [email or web address] . [place] [facility]  [place]) Kristian Covey, NP; Dwyane Dee, MD; Leonidas Romberg, PA o [address] [place], [place], [place] [address] o [phone] o Mon-Fri 8:00-5:00 o Babies seen by providers at [place] o Accepting Medicaid . [place] ([facility] [employer]) Dairl Ponder, MD; Rayvon Char, NP; Melina Modena, MD o [address] [place], [place], [place] [address] o [phone] o Mon-Fri 8:00-5:30, Sat&Sun by appointment (phones open at 8:30) o Babies seen by [facility] providers o Accepting Medicaid o Must be a first-time baby or sibling of current patient . [place], [address], [place], [place]  [address] o [phone]   Fax - [phone]  [place] [phone] & [phone]) . [place], [place]; [place], [place]; Benjamine Mola, MD; [place], [place]; Harrell Lark, MD o [address] [place], [place] [address] o [phone] o Mon-Thur 8:00-7:00, Fri 8:00-5:00, Sat 8:00-12:00, Sun 9:00-12:00 o Babies seen by [facility] providers o Accepting Medicaid . [facility] at [facility], MD; Ruthann Cancer, MD; [facility], MD o [address], [place], [place] [address] o [phone] o Mon-Thur 8:00-5:00 o Babies seen by providers at [facility] o Accepting Medicaid . [facility] at [place], MD; Coe[place]Goins, MD; Amedeo Plenty, MD; Bobby Rumpf, MD; List, MD; Lavonia Drafts, MD; Ruthann Cancer, MD; Selinda Eon, MD; Audie Box, MD; Jim Like, MD; Christie Nottingham, MD; Hubbard Hartshorn, MD; Modena Nunnery, MD o [address] [place], [place]  [address] o [phone] o Mon-Fri 8:00-5:30, Sat (Oct.-Mar.) 9:00-1:00 o Babies seen by providers at [facility] o Accepting Medicaid o Must fill out new patient packet, available online at [email or web address] . [facility] [place] [facility] [facility]) Simone Curia, NP; Tiburcio Pea, NP; Tresa Endo, NP; Whitney Post, MD;  [place], [place]; Hennie Duos, MD; Wynne Dust, MD; Kavin Leech, NP o [address], [place], [place] [address] o [phone] o Mon-Thur 8:00-5:30, Fri 8:00-5:00 o Babies seen by providers at [facility] o Accepting [facility] [phone]) . [facility] o Ramos, [place]; Stella, MD; Tanya Nones, MD; [place], [place] o [address] [place], [place] [address] o [phone] o Mon-Fri 8:00-5:00 o Babies seen by providers at [facility] o Accepting [facility] [phone]) . [facility] o Ney, DO; Lenise Arena, MD; [place], [place] o [address], [place], [place] [address] o [phone] o Mon-Fri 8:00-5:00 o Babies seen by providers at [facility] o Does NOT accept Medicaid o Limited appointment availability, please call early in hospitalization  . Nature conservation officer at [facility] o Bangor, DO; [place], MD o [address], [place], [place] [address] o [phone] o Mon-Fri 8:00-5:00 o Babies seen by [facility] providers o Does NOT accept Medicaid . [facility] Lorrine Kin, MD; Ninetta Lights, MD; [place], [place]; Esmond, MD o [address], [place], [place] [address] o [phone] o Mon-Fri 8:00-5:00 o After hours clinic [facility] [place], [place] [address]) [phone] Mon-Fri 5:00-8:00, Sat 12:00-6:00, Sun 10:00-4:00 o Babies seen by [facility] providers o Accepting Medicaid . [facility] o [address], [place], [place]  [address] o [phone]   Fax - [phone]  [place] [phone]) . Nature conservation officer at [facility], MD o [address] [place], [place], [place] [address] o [phone] o Mon-Fri 8:00-5:00 o Babies seen by [facility] providers o Does NOT accept Medicaid . [facility] [place] [facility] [place]) Tomi Likens, MD o [address] [place] [address], [place], [place]  [address] o [phone] o Mon-Thur 8:00-7:00, Fri 8:00-5:00, Sat 8:00-12:00 o Babies seen by providers at [facility] o Accepting Medicaid - but does not have vaccinations in office (must be received elsewhere) o Limited availability, please call early in hospitalization  [place] ([address]) . [facility]  o Wyvonne Lenz, MD o [address], [place] [address] o [phone]  Fax [phone]      The Maternity Assessment Unit (MAU) is located at the [facility] at [facility]. The address is: [address] [place], [place], [place] [address]. Please see map below for additional directions.    The Maternity Assessment Unit is designed to help you during your pregnancy, and for up to 6 weeks after delivery, with any pregnancy- or postpartum-related emergencies, if you think you are in labor, or if your water has broken. For example, if you experience nausea and vomiting, vaginal bleeding, severe abdominal or pelvic pain, elevated blood pressure or other problems related to your pregnancy or postpartum time, please come to the Maternity Assessment Unit for assistance.

## 2020-03-31 NOTE — Progress Notes (Signed)
   PRENATAL VISIT NOTE  Subjective:  Christy Melendez is a 25 y.o. G1P0 at [redacted]w[redacted]d being seen today for ongoing prenatal care.  She is currently monitored for the following issues for this low-risk pregnancy and has Supervision of other normal pregnancy, antepartum; Group beta Strep positive; Chlamydia; Marginal insertion of umbilical cord affecting management of mother; and Penicillin allergy on their problem list.  Patient reports no complaints.  Contractions: Not present. Vag. Bleeding: None.  Movement: Present. Denies leaking of fluid.   The following portions of the patient's history were reviewed and updated as appropriate: allergies, current medications, past family history, past medical history, past social history, past surgical history and problem list.   Objective:   Vitals:   03/31/20 1006  BP: 117/78  Pulse: (!) 103  Weight: 207 lb 11.2 oz (94.2 kg)    Fetal Status: Fetal Heart Rate (bpm): 148 Fundal Height: 34 cm Movement: Present     General:  Alert, oriented and cooperative. Patient is in no acute distress.  Skin: Skin is warm and dry. No rash noted.   Cardiovascular: Normal heart rate noted  Respiratory: Normal respiratory effort, no problems with respiration noted  Abdomen: Soft, gravid, appropriate for gestational age.  Pain/Pressure: Present     Pelvic: Cervical exam deferred        Extremities: Normal range of motion.  Edema: None  Mental Status: Normal mood and affect. Normal behavior. Normal judgment and thought content.   Assessment and Plan:  Pregnancy: G1P0 at [redacted]w[redacted]d 1. Supervision of other normal pregnancy, antepartum -seen by pregnancy navigator -doing well. Reviewed upcoming appointments. Patient prefer in person visits; will adjust for remainder of pregnancy.  -given [facility] map as patient was unsure where to go. Discussed current visitor policy - [facility] AMB BABYSCRIPTS SCHEDULE OPTIMIZATION  2. Penicillin allergy -GBS + urine earlier in pregnancy. Has PCN  allergy, per patient had hives & trouble breathing, had to be hospitalized. Will get GBS swab for sensitivities at 36 wk visit.   Preterm labor symptoms and general obstetric precautions including but not limited to vaginal bleeding, contractions, leaking of fluid and fetal movement were reviewed in detail with the patient. Please refer to After Visit Summary for other counseling recommendations.   Return in about 2 weeks (around 04/14/2020) for Routine OB, in person.  Future Appointments  Date Time Provider Department Center  04/09/2020 10:45 AM [facility] NURSE [facility]  04/09/2020 11:00 AM [facility] US1 [facility]  04/23/2020 11:00 AM [facility] NURSE [facility]  04/23/2020 11:15 AM [facility] US2 [facility]    Judeth Horn, NP

## 2020-04-09 ENCOUNTER — Ambulatory Visit: Payer: Medicaid Other

## 2020-04-14 ENCOUNTER — Other Ambulatory Visit: Payer: Self-pay

## 2020-04-14 ENCOUNTER — Encounter: Payer: Self-pay | Admitting: *Deleted

## 2020-04-14 ENCOUNTER — Ambulatory Visit: Payer: Medicaid Other | Attending: Obstetrics

## 2020-04-14 ENCOUNTER — Ambulatory Visit: Payer: Medicaid Other | Admitting: *Deleted

## 2020-04-14 DIAGNOSIS — E669 Obesity, unspecified: Secondary | ICD-10-CM

## 2020-04-14 DIAGNOSIS — O99213 Obesity complicating pregnancy, third trimester: Secondary | ICD-10-CM | POA: Insufficient documentation

## 2020-04-14 DIAGNOSIS — O4103X0 Oligohydramnios, third trimester, not applicable or unspecified: Secondary | ICD-10-CM | POA: Insufficient documentation

## 2020-04-14 DIAGNOSIS — B951 Streptococcus, group B, as the cause of diseases classified elsewhere: Secondary | ICD-10-CM | POA: Diagnosis present

## 2020-04-14 DIAGNOSIS — Z3A35 35 weeks gestation of pregnancy: Secondary | ICD-10-CM | POA: Insufficient documentation

## 2020-04-14 DIAGNOSIS — Z880 Allergy status to penicillin: Secondary | ICD-10-CM | POA: Insufficient documentation

## 2020-04-14 DIAGNOSIS — Z3480 Encounter for supervision of other normal pregnancy, unspecified trimester: Secondary | ICD-10-CM

## 2020-04-14 DIAGNOSIS — O43199 Other malformation of placenta, unspecified trimester: Secondary | ICD-10-CM | POA: Diagnosis present

## 2020-04-14 DIAGNOSIS — O43193 Other malformation of placenta, third trimester: Secondary | ICD-10-CM | POA: Diagnosis not present

## 2020-04-15 ENCOUNTER — Ambulatory Visit (INDEPENDENT_AMBULATORY_CARE_PROVIDER_SITE_OTHER): Payer: Medicaid Other | Admitting: Advanced Practice Midwife

## 2020-04-15 VITALS — BP 125/75 | HR 95 | Wt 212.0 lb

## 2020-04-15 DIAGNOSIS — B951 Streptococcus, group B, as the cause of diseases classified elsewhere: Secondary | ICD-10-CM

## 2020-04-15 DIAGNOSIS — Z3A35 35 weeks gestation of pregnancy: Secondary | ICD-10-CM

## 2020-04-15 DIAGNOSIS — O99210 Obesity complicating pregnancy, unspecified trimester: Secondary | ICD-10-CM

## 2020-04-15 DIAGNOSIS — Z3480 Encounter for supervision of other normal pregnancy, unspecified trimester: Secondary | ICD-10-CM

## 2020-04-15 DIAGNOSIS — Z880 Allergy status to penicillin: Secondary | ICD-10-CM

## 2020-04-15 NOTE — Progress Notes (Signed)
   PRENATAL VISIT NOTE  Subjective:  Christy Melendez is a 25 y.o. G1P0 at [redacted]w[redacted]d being seen today for ongoing prenatal care.  She is currently monitored for the following issues for this low-risk pregnancy and has Supervision of other normal pregnancy, antepartum; Group beta Strep positive; Chlamydia; Marginal insertion of umbilical cord affecting management of mother; and Penicillin allergy on their problem list.  Patient reports no complaints.  Contractions: Not present. Vag. Bleeding: None.  Movement: Present. Denies leaking of fluid.   The following portions of the patient's history were reviewed and updated as appropriate: allergies, current medications, past family history, past medical history, past social history, past surgical history and problem list. Problem list updated.  Objective:   Vitals:   04/15/20 0954  BP: 125/75  Pulse: 95  Weight: 212 lb (96.2 kg)    Fetal Status: Fetal Heart Rate (bpm): 138 Fundal Height: 36 cm Movement: Present     General:  Alert, oriented and cooperative. Patient is in no acute distress.  Skin: Skin is warm and dry. No rash noted.   Cardiovascular: Normal heart rate noted  Respiratory: Normal respiratory effort, no problems with respiration noted  Abdomen: Soft, gravid, appropriate for gestational age.  Pain/Pressure: Present     Pelvic: Cervical exam deferred        Extremities: Normal range of motion.  Edema: None  Mental Status: Normal mood and affect. Normal behavior. Normal judgment and thought content.   Assessment and Plan:  Pregnancy: G1P0 at [redacted]w[redacted]d  1. Supervision of other normal pregnancy, antepartum - Routine care\ - Normal AFI and fetal activity on ultrasound yesterday  2. [redacted] weeks gestation of pregnancy   3. Penicillin allergy - GBS bacteruria, will collect to run sensitivities next visit  4. Obesity in pregnancy - TWG 14 lbs  5. Group beta Strep positive -   Preterm labor symptoms and general obstetric precautions  including but not limited to vaginal bleeding, contractions, leaking of fluid and fetal movement were reviewed in detail with the patient. Please refer to After Visit Summary for other counseling recommendations.  Return in about 1 week (around 04/22/2020) for in person for swabs (needs sensitivities due to PCN allergy).  Future Appointments  Date Time Provider Department Center  04/22/2020 10:55 AM Gita Kudo, MD [facility]  04/23/2020 11:00 AM [facility]  04/23/2020 11:15 AM [facility] US2 [facility]    Calvert Cantor, CNM

## 2020-04-15 NOTE — Patient Instructions (Signed)

## 2020-04-22 ENCOUNTER — Encounter: Payer: Medicaid Other | Admitting: Obstetrics and Gynecology

## 2020-04-23 ENCOUNTER — Ambulatory Visit: Payer: Medicaid Other | Admitting: *Deleted

## 2020-04-23 ENCOUNTER — Other Ambulatory Visit: Payer: Self-pay

## 2020-04-23 ENCOUNTER — Other Ambulatory Visit: Payer: Self-pay | Admitting: Obstetrics

## 2020-04-23 ENCOUNTER — Ambulatory Visit: Payer: Medicaid Other | Attending: Obstetrics

## 2020-04-23 ENCOUNTER — Encounter: Payer: Self-pay | Admitting: *Deleted

## 2020-04-23 DIAGNOSIS — O99213 Obesity complicating pregnancy, third trimester: Secondary | ICD-10-CM | POA: Diagnosis not present

## 2020-04-23 DIAGNOSIS — Z3480 Encounter for supervision of other normal pregnancy, unspecified trimester: Secondary | ICD-10-CM | POA: Insufficient documentation

## 2020-04-23 DIAGNOSIS — O4103X0 Oligohydramnios, third trimester, not applicable or unspecified: Secondary | ICD-10-CM | POA: Insufficient documentation

## 2020-04-23 DIAGNOSIS — O368130 Decreased fetal movements, third trimester, not applicable or unspecified: Secondary | ICD-10-CM | POA: Diagnosis not present

## 2020-04-23 DIAGNOSIS — Z3A36 36 weeks gestation of pregnancy: Secondary | ICD-10-CM | POA: Insufficient documentation

## 2020-04-23 DIAGNOSIS — O43193 Other malformation of placenta, third trimester: Secondary | ICD-10-CM | POA: Insufficient documentation

## 2020-04-23 DIAGNOSIS — Z880 Allergy status to penicillin: Secondary | ICD-10-CM | POA: Insufficient documentation

## 2020-04-23 DIAGNOSIS — O43199 Other malformation of placenta, unspecified trimester: Secondary | ICD-10-CM

## 2020-04-23 DIAGNOSIS — B951 Streptococcus, group B, as the cause of diseases classified elsewhere: Secondary | ICD-10-CM

## 2020-04-23 DIAGNOSIS — E669 Obesity, unspecified: Secondary | ICD-10-CM

## 2020-04-23 NOTE — Procedures (Signed)
Christy Melendez 1995/04/11 [redacted]w[redacted]d  Fetus A Non-Stress Test Interpretation for 04/23/20  Indication: Unsatisfactory BPP  Fetal Heart Rate A Mode: External Baseline Rate (A): 130 bpm Variability: Moderate Accelerations: 15 x 15 Decelerations: None Multiple birth?: No  Uterine Activity Mode: Palpation,Toco Contraction Frequency (min): occ with ui Contraction Duration (sec): 40 Contraction Quality: Mild Resting Tone Palpated: Relaxed Resting Time: Adequate  Interpretation (Fetal Testing) Nonstress Test Interpretation: Reactive Overall Impression: Reassuring for gestational age Comments: Dr. Parke Poisson Dr. Grace Bushy reviewed tracing.

## 2020-04-24 ENCOUNTER — Telehealth: Payer: Self-pay

## 2020-04-24 NOTE — Telephone Encounter (Signed)
Spoke with patient to let her know that ultrasound appointments needed next week will be done at [facility] on first floor of bldg.

## 2020-04-30 ENCOUNTER — Encounter: Payer: Medicaid Other | Admitting: Obstetrics and Gynecology

## 2020-04-30 ENCOUNTER — Other Ambulatory Visit: Payer: Medicaid Other

## 2020-05-08 ENCOUNTER — Other Ambulatory Visit: Payer: Self-pay

## 2020-05-08 ENCOUNTER — Other Ambulatory Visit (HOSPITAL_COMMUNITY)
Admission: RE | Admit: 2020-05-08 | Discharge: 2020-05-08 | Disposition: A | Discharge status: Home / Self Care | Payer: Medicaid Other | Source: Ambulatory Visit | Attending: Student | Admitting: Student

## 2020-05-08 ENCOUNTER — Ambulatory Visit (INDEPENDENT_AMBULATORY_CARE_PROVIDER_SITE_OTHER): Payer: Medicaid Other | Admitting: Student

## 2020-05-08 VITALS — BP 96/70 | HR 92 | Wt 216.3 lb

## 2020-05-08 DIAGNOSIS — Z3480 Encounter for supervision of other normal pregnancy, unspecified trimester: Secondary | ICD-10-CM | POA: Diagnosis present

## 2020-05-08 DIAGNOSIS — Z3483 Encounter for supervision of other normal pregnancy, third trimester: Secondary | ICD-10-CM | POA: Diagnosis not present

## 2020-05-08 DIAGNOSIS — Z880 Allergy status to penicillin: Secondary | ICD-10-CM

## 2020-05-08 DIAGNOSIS — Z3A38 38 weeks gestation of pregnancy: Secondary | ICD-10-CM

## 2020-05-08 DIAGNOSIS — O4103X1 Oligohydramnios, third trimester, fetus 1: Secondary | ICD-10-CM

## 2020-05-08 NOTE — Progress Notes (Signed)
   PRENATAL VISIT NOTE  Subjective:  Christy Melendez is a 25 y.o. G1P0 at [redacted]w[redacted]d being seen today for ongoing prenatal care.  She is currently monitored for the following issues for this low-risk pregnancy and has Supervision of other normal pregnancy, antepartum; Group beta Strep positive; Chlamydia; Marginal insertion of umbilical cord affecting management of mother; and Penicillin allergy on their problem list.  Patient reports only feeling a few movements today. .  Contractions: Irritability. Vag. Bleeding: None.  Movement: Present. Denies leaking of fluid.   The following portions of the patient's history were reviewed and updated as appropriate: allergies, current medications, past family history, past medical history, past social history, past surgical history and problem list.   Objective:   Vitals:   05/08/20 1323  BP: 96/70  Pulse: 92  Weight: 216 lb 4.8 oz (98.1 kg)    Fetal Status: Fetal Heart Rate (bpm): 135 Fundal Height: 38 cm Movement: Present     General:  Alert, oriented and cooperative. Patient is in no acute distress.  Skin: Skin is warm and dry. No rash noted.   Cardiovascular: Normal heart rate noted  Respiratory: Normal respiratory effort, no problems with respiration noted  Abdomen: Soft, gravid, appropriate for gestational age.  Pain/Pressure: Present     Pelvic: Cervical exam performed in the presence of a chaperone Dilation: Closed Effacement (%): Thick    Extremities: Normal range of motion.  Edema: None  Mental Status: Normal mood and affect. Normal behavior. Normal judgment and thought content.   Assessment and Plan:  Pregnancy: G1P0 at [redacted]w[redacted]d 1. Supervision of other normal pregnancy, antepartum -confirm presentation tomorrow at [facility]; RN will make appt.  -Discussed with patient that she will need vancomycin in labor since she has GBS in her urine and clindamycin-resistant - GC/Chlamydia probe amp ()not at [facility] -NST today; reactive Term labor  symptoms and general obstetric precautions including but not limited to vaginal bleeding, contractions, leaking of fluid and fetal movement were reviewed in detail with the patient. Please refer to After Visit Summary for other counseling recommendations.   Return in about 1 week (around 05/15/2020), or 1 week for HROB with MD only.  Future Appointments  Date Time Provider Department Center  05/09/2020  1:00 PM [facility] NURSE [facility]  05/09/2020  1:15 PM [facility] US2 [facility]  05/14/2020  1:40 PM Berino Bing, MD [facility]    Marylene Land, CNM

## 2020-05-09 ENCOUNTER — Encounter (HOSPITAL_COMMUNITY): Payer: Self-pay | Admitting: Obstetrics & Gynecology

## 2020-05-09 ENCOUNTER — Encounter: Payer: Self-pay | Admitting: *Deleted

## 2020-05-09 ENCOUNTER — Inpatient Hospital Stay (HOSPITAL_COMMUNITY)
Admission: AD | Admit: 2020-05-09 | Discharge: 2020-05-12 | DRG: 807 | Disposition: A | Discharge status: Home / Self Care | Payer: Medicaid Other | Attending: Family Medicine | Admitting: Family Medicine

## 2020-05-09 ENCOUNTER — Ambulatory Visit: Payer: Medicaid Other | Admitting: *Deleted

## 2020-05-09 ENCOUNTER — Ambulatory Visit (HOSPITAL_BASED_OUTPATIENT_CLINIC_OR_DEPARTMENT_OTHER): Payer: Medicaid Other

## 2020-05-09 ENCOUNTER — Other Ambulatory Visit: Payer: Self-pay

## 2020-05-09 DIAGNOSIS — Z30430 Encounter for insertion of intrauterine contraceptive device: Secondary | ICD-10-CM | POA: Diagnosis not present

## 2020-05-09 DIAGNOSIS — Z3480 Encounter for supervision of other normal pregnancy, unspecified trimester: Secondary | ICD-10-CM

## 2020-05-09 DIAGNOSIS — O43893 Other placental disorders, third trimester: Secondary | ICD-10-CM | POA: Diagnosis not present

## 2020-05-09 DIAGNOSIS — Z975 Presence of (intrauterine) contraceptive device: Secondary | ICD-10-CM

## 2020-05-09 DIAGNOSIS — Z3490 Encounter for supervision of normal pregnancy, unspecified, unspecified trimester: Secondary | ICD-10-CM | POA: Diagnosis present

## 2020-05-09 DIAGNOSIS — B951 Streptococcus, group B, as the cause of diseases classified elsewhere: Secondary | ICD-10-CM

## 2020-05-09 DIAGNOSIS — O99824 Streptococcus B carrier state complicating childbirth: Secondary | ICD-10-CM | POA: Diagnosis present

## 2020-05-09 DIAGNOSIS — O43199 Other malformation of placenta, unspecified trimester: Secondary | ICD-10-CM | POA: Diagnosis present

## 2020-05-09 DIAGNOSIS — O368130 Decreased fetal movements, third trimester, not applicable or unspecified: Principal | ICD-10-CM | POA: Diagnosis present

## 2020-05-09 DIAGNOSIS — O4103X1 Oligohydramnios, third trimester, fetus 1: Secondary | ICD-10-CM | POA: Diagnosis not present

## 2020-05-09 DIAGNOSIS — Z20822 Contact with and (suspected) exposure to covid-19: Secondary | ICD-10-CM | POA: Diagnosis present

## 2020-05-09 DIAGNOSIS — Z880 Allergy status to penicillin: Secondary | ICD-10-CM

## 2020-05-09 DIAGNOSIS — Z3A38 38 weeks gestation of pregnancy: Secondary | ICD-10-CM

## 2020-05-09 DIAGNOSIS — O43123 Velamentous insertion of umbilical cord, third trimester: Secondary | ICD-10-CM | POA: Diagnosis present

## 2020-05-09 DIAGNOSIS — A749 Chlamydial infection, unspecified: Secondary | ICD-10-CM | POA: Diagnosis present

## 2020-05-09 DIAGNOSIS — O9832 Other infections with a predominantly sexual mode of transmission complicating childbirth: Secondary | ICD-10-CM | POA: Diagnosis not present

## 2020-05-09 LAB — CBC
HCT: 39.7 % (ref 36.0–46.0)
Hemoglobin: 13.3 g/dL (ref 12.0–15.0)
MCH: 29.8 pg (ref 26.0–34.0)
MCHC: 33.5 g/dL (ref 30.0–36.0)
MCV: 88.8 fL (ref 80.0–100.0)
Platelets: 283 10*3/uL (ref 150–400)
RBC: 4.47 MIL/uL (ref 3.87–5.11)
RDW: 15.8 % — ABNORMAL HIGH (ref 11.5–15.5)
WBC: 10 10*3/uL (ref 4.0–10.5)
nRBC: 0 % (ref 0.0–0.2)

## 2020-05-09 LAB — RESP PANEL BY RT-PCR (FLU A&B, COVID) ARPGX2
Influenza A by PCR: NEGATIVE
Influenza B by PCR: NEGATIVE
SARS Coronavirus 2 by RT PCR: NEGATIVE

## 2020-05-09 LAB — GC/CHLAMYDIA PROBE AMP (~~LOC~~) NOT AT ARMC
Chlamydia: NEGATIVE
Comment: NEGATIVE
Comment: NORMAL
Neisseria Gonorrhea: NEGATIVE

## 2020-05-09 LAB — TYPE AND SCREEN
ABO/RH(D): A POS
Antibody Screen: NEGATIVE

## 2020-05-09 MED ORDER — LACTATED RINGERS IV SOLN: 500.0000 mL | INTRAVENOUS | Status: DC | PRN

## 2020-05-09 MED ORDER — ONDANSETRON HCL 4 MG/2ML IJ SOLN
4.0000 mg | Freq: Four times a day (QID) | INTRAMUSCULAR | Status: DC | PRN
  Administered 2020-05-11: 4 mg via INTRAVENOUS
  Filled 2020-05-09: qty 2

## 2020-05-09 MED ORDER — ACETAMINOPHEN 325 MG PO TABS
650.0000 mg | ORAL_TABLET | ORAL | Status: DC | PRN
  Administered 2020-05-10: 650 mg via ORAL
  Filled 2020-05-09: qty 2

## 2020-05-09 MED ORDER — OXYTOCIN BOLUS FROM INFUSION
333.0000 mL | Freq: Once | INTRAVENOUS | Status: AC
  Administered 2020-05-11: 333 mL via INTRAVENOUS

## 2020-05-09 MED ORDER — VANCOMYCIN HCL IN DEXTROSE 1-5 GM/200ML-% IV SOLN
1000.0000 mg | Freq: Two times a day (BID) | INTRAVENOUS | Status: DC
  Administered 2020-05-09 – 2020-05-10 (×3): 1000 mg via INTRAVENOUS
  Filled 2020-05-09 (×3): qty 200

## 2020-05-09 MED ORDER — FENTANYL CITRATE (PF) 100 MCG/2ML IJ SOLN
50.0000 ug | INTRAMUSCULAR | Status: DC | PRN
  Administered 2020-05-10: 100 ug via INTRAVENOUS
  Administered 2020-05-10: 50 ug via INTRAVENOUS
  Filled 2020-05-09 (×2): qty 2

## 2020-05-09 MED ORDER — LIDOCAINE HCL (PF) 1 % IJ SOLN: 30.0000 mL | INTRAMUSCULAR | Status: DC | PRN

## 2020-05-09 MED ORDER — OXYTOCIN-SODIUM CHLORIDE 30-0.9 UT/500ML-% IV SOLN: 2.5000 [IU]/h | INTRAVENOUS | Status: DC

## 2020-05-09 MED ORDER — MISOPROSTOL 50MCG HALF TABLET
50.0000 ug | ORAL_TABLET | ORAL | Status: DC | PRN
Start: 2020-05-09 — End: 2020-05-11
  Administered 2020-05-09 – 2020-05-10 (×3): 50 ug via BUCCAL
  Filled 2020-05-09 (×3): qty 1

## 2020-05-09 MED ORDER — LACTATED RINGERS IV SOLN: INTRAVENOUS | Status: DC

## 2020-05-09 MED ORDER — LEVONORGESTREL 19.5 MCG/DAY IU IUD
INTRAUTERINE_SYSTEM | Freq: Once | INTRAUTERINE | Status: AC
  Administered 2020-05-11: 1 via INTRAUTERINE
  Filled 2020-05-09: qty 1

## 2020-05-09 MED ORDER — SOD CITRATE-CITRIC ACID 500-334 MG/5ML PO SOLN: 30.0000 mL | ORAL | Status: DC | PRN

## 2020-05-09 NOTE — H&P (Signed)
OBSTETRIC ADMISSION HISTORY AND PHYSICAL  Christy Melendez is a 25 y.o. female G1P0 with IUP at [redacted]w[redacted]d by 11wk [place] presenting for IOL for decreased fetal movement, 6/10 BPP. She reports decreased FMs, No LOF, no VB, no blurry vision, headaches or peripheral edema, and RUQ pain.  She plans on breast feeding. She request IUD for birth control. She received her prenatal care at [facility]   Dating: By 11wk [place] --->  Estimated Date of Delivery: 05/19/20  Sono:    @[redacted]w[redacted]d , CWD, normal anatomy, cephalic presentation, 2734g, EFW   Prenatal History/Complications:  -marginal cord insertion -BMI 35 -GBS pos, PCN allergy     Past Medical History: Past Medical History:  Diagnosis Date  . Medical history non-contributory     Past Surgical History: Past Surgical History:  Procedure Laterality Date  . NO PAST SURGERIES      Obstetrical History: OB History    Gravida  1   Para      Term      Preterm      AB      Living        SAB      IAB      Ectopic      Multiple      Live Births              Social History Social History   Socioeconomic History  . Marital status: Single    Spouse name: Not on file  . Number of children: Not on file  . Years of education: Not on file  . Highest education level: High school graduate  Occupational History  . Not on file  Tobacco Use  . Smoking status: Never Smoker  . Smokeless tobacco: Never Used  Vaping Use  . Vaping Use: Never used  Substance and Sexual Activity  . Alcohol use: Never  . Drug use: Never  . Sexual activity: Not on file  Other Topics Concern  . Not on file  Social History Narrative  . Not on file   Social Determinants of Health   Financial Resource Strain: Not on file  Food Insecurity: Food Insecurity Present  . Worried About 62% in the Last Year: Sometimes true  . Ran Out of Food in the Last Year: Sometimes true  Transportation Needs: No Transportation Needs  . Lack of Transportation  (Medical): No  . Lack of Transportation (Non-Medical): No  Physical Activity: Not on file  Stress: No Stress Concern Present  . Feeling of Stress : Only a little  Social Connections: Unknown  . Frequency of Communication with Friends and Family: More than three times a week  . Frequency of Social Gatherings with Friends and Family: More than three times a week  . Attends Religious Services: 1 to 4 times per year  . Active Member of Clubs or Organizations: No  . Attends Programme researcher, broadcasting/film/video Meetings: 1 to 4 times per year  . Marital Status: Patient refused    Family History: Family History  Problem Relation Age of Onset  . Healthy Mother   . Heart disease Neg Hx   . Hypertension Neg Hx   . Diabetes Neg Hx   . Asthma Neg Hx     Allergies: Allergies  Allergen Reactions  . Penicillins Anaphylaxis and Hives    Medications Prior to Admission  Medication Sig Dispense Refill Last Dose  . prenatal vitamin w/FE, FA (PRENATAL 1 + 1) 27-1 MG TABS tablet Take 1 tablet  by mouth daily at 12 noon. 30 tablet 11 05/09/2020 at 1000     Review of Systems   All systems reviewed and negative except as stated in HPI  Blood pressure 118/73, pulse 84, temperature 97.8 F (36.6 C), temperature source Oral, resp. rate 19, height 5\' 6"  (1.676 m), weight 98.2 kg, last menstrual period 08/09/2019, SpO2 99 %. General appearance: alert, cooperative and appears stated age Lungs: normal effort Abdomen: soft, non-tender; bowel sounds normal Extremities: Homans sign is negative, no sign of DVT Presentation: cephalic Fetal monitoring baseline 125, mod variability, pos accels, no decels  Uterine activityNone     Prenatal labs: ABO, Rh: --/--/A POS (04/08 1555) Antibody: NEG (04/08 1555) Rubella: 6.56 (10/12 1523) RPR: Non Reactive (01/28 0837)  HBsAg: Negative (10/12 1523)  HIV: Non Reactive (01/28 0837)  GBS:   POS, PCN allergy  2 hr Glucola normal Genetic screening  Low risk NIPS, neg horizon   Anatomy 03-04-1989 Normal except marginal cord insertion   Prenatal Transfer Tool  Maternal Diabetes: No Genetic Screening: Normal Maternal Ultrasounds/Referrals: Normal, marginal cord insertion  Fetal Ultrasounds or other Referrals:  Referred to Materal Fetal Medicine  Maternal Substance Abuse:  No Significant Maternal Medications:  None Significant Maternal Lab Results: Group B Strep positive  Results for orders placed or performed during the hospital encounter of 05/09/20 (from the past 24 hour(s))  Type and screen [facility]   Collection Time: 05/09/20  3:55 PM  Result Value Ref Range   ABO/RH(D) A POS    Antibody Screen NEG    Sample Expiration      05/12/2020,2359 Performed at [facility], [address] [place], [place]   Resp Panel by RT-PCR (Flu A&B, Covid) Nasopharyngeal Swab   Collection Time: 05/09/20  4:06 PM   Specimen: Nasopharyngeal Swab; Nasopharyngeal(NP) swabs in vial transport medium  Result Value Ref Range   SARS Coronavirus 2 by RT PCR NEGATIVE NEGATIVE   Influenza A by PCR NEGATIVE NEGATIVE   Influenza B by PCR NEGATIVE NEGATIVE  CBC   Collection Time: 05/09/20  4:06 PM  Result Value Ref Range   WBC 10.0 4.0 - 10.5 K/uL   RBC 4.47 3.87 - 5.11 MIL/uL   Hemoglobin 13.3 12.0 - 15.0 g/dL   HCT 07/09/20 19.1 - 47.8 %   MCV 88.8 80.0 - 100.0 fL   MCH 29.8 26.0 - 34.0 pg   MCHC 33.5 30.0 - 36.0 g/dL   RDW 29.5 (H) 62.1 - 30.8 %   Platelets 283 150 - 400 K/uL   nRBC 0.0 0.0 - 0.2 %    Patient Active Problem List   Diagnosis Date Noted  . Penicillin allergy 03/31/2020  . Group beta Strep positive 01/16/2020  . Chlamydia 01/16/2020  . Marginal insertion of umbilical cord affecting management of mother 01/16/2020  . Supervision of other normal pregnancy, antepartum 11/13/2019    Assessment/Plan:  Christy Melendez is a 25 y.o. G1P0 at [redacted]w[redacted]d here for IOL for 6/10 BPP, decreased fetal movement at term.   #Induction of Labor:  Start with cytotec, FB when able  #Pain: Per patient request #FWB: Cat I currently, EFW 7lb by Leopold's  #ID:  GBS pos, PCN allergy. Start Vancomycin    #MOF: breast #MOC: postplacental Liletta IUD, verbally consented at bedside.   #Circ:  no  [redacted]w[redacted]d, MD  05/09/2020, 5:41 PM

## 2020-05-09 NOTE — MAU Note (Signed)
Was at [place] appt, baby wasn't moving as much. So was told to come here. Denies pain, bleeding or ROM.

## 2020-05-09 NOTE — Progress Notes (Signed)
Labor Progress Note Christy Melendez is a 25 y.o. G1P0 at [redacted]w[redacted]d presented for IOL-4/8 BPP. S: Doing well without complaints.  O:  BP 112/66   Pulse 93   Temp 98.3 F (36.8 C) (Oral)   Resp 16   Ht 5\' 6"  (1.676 m)   Wt 98.2 kg   LMP 08/09/2019   SpO2 99%   BMI 34.96 kg/m  EFM: baseline 130bpm/mod variability/+ accels/no decels Toco: quiet  CVE: Dilation: 1.5 Effacement (%): 50 Station: -2 Presentation: Vertex Exam by:: [email or web address] MD   A&P: 25 y.o. G1P0 [redacted]w[redacted]d presented for IOL-4/8 BPP. #IOL: Presented to clinic 4/8 and was found to have 4/8 BPP (breathing, movement). Decision made for IOL at that time. S/p cyto x1. Attempted to place FB at this check but cervix unable to hold balloon given effaced. Re-dosed cytotec. #Pain: PRN, desires epidural #FWB: cat 1 #GBS positive, PCN allergy, vanc #marginal cord insert: placenta to path #Contraception: desires post placental IUD, previously ordered. Discussed risks/benefits with patient and consents signed.  6/8, MD 10:58 PM

## 2020-05-10 ENCOUNTER — Inpatient Hospital Stay (HOSPITAL_COMMUNITY): Payer: Medicaid Other | Admitting: Anesthesiology

## 2020-05-10 LAB — RPR: RPR Ser Ql: NONREACTIVE

## 2020-05-10 MED ORDER — LIDOCAINE HCL (PF) 1 % IJ SOLN
INTRAMUSCULAR | Status: DC | PRN
  Administered 2020-05-10: 11 mL via EPIDURAL

## 2020-05-10 MED ORDER — TERBUTALINE SULFATE 1 MG/ML IJ SOLN: 0.2500 mg | Freq: Once | INTRAMUSCULAR | Status: DC | PRN

## 2020-05-10 MED ORDER — EPHEDRINE 5 MG/ML INJ: 10.0000 mg | INTRAVENOUS | Status: DC | PRN

## 2020-05-10 MED ORDER — DIPHENHYDRAMINE HCL 50 MG/ML IJ SOLN
25.0000 mg | Freq: Four times a day (QID) | INTRAMUSCULAR | Status: DC | PRN
  Administered 2020-05-10: 25 mg via INTRAVENOUS
  Filled 2020-05-10: qty 1

## 2020-05-10 MED ORDER — DIPHENHYDRAMINE HCL 50 MG/ML IJ SOLN: 12.5000 mg | INTRAMUSCULAR | Status: DC | PRN

## 2020-05-10 MED ORDER — LACTATED RINGERS AMNIOINFUSION
INTRAVENOUS | Status: DC
  Administered 2020-05-10: 100 mL via INTRAUTERINE

## 2020-05-10 MED ORDER — PHENYLEPHRINE 40 MCG/ML (10ML) SYRINGE FOR IV PUSH (FOR BLOOD PRESSURE SUPPORT)
80.0000 ug | PREFILLED_SYRINGE | INTRAVENOUS | Status: DC | PRN
  Filled 2020-05-10: qty 10

## 2020-05-10 MED ORDER — FENTANYL-BUPIVACAINE-NACL 0.5-0.125-0.9 MG/250ML-% EP SOLN
12.0000 mL/h | EPIDURAL | Status: DC | PRN
  Administered 2020-05-10: 12 mL/h via EPIDURAL
  Filled 2020-05-10: qty 250

## 2020-05-10 MED ORDER — PHENYLEPHRINE 40 MCG/ML (10ML) SYRINGE FOR IV PUSH (FOR BLOOD PRESSURE SUPPORT): 80.0000 ug | PREFILLED_SYRINGE | INTRAVENOUS | Status: DC | PRN

## 2020-05-10 MED ORDER — OXYTOCIN-SODIUM CHLORIDE 30-0.9 UT/500ML-% IV SOLN
1.0000 m[IU]/min | INTRAVENOUS | Status: DC
  Administered 2020-05-10: 2 m[IU]/min via INTRAVENOUS
  Filled 2020-05-10: qty 500

## 2020-05-10 MED ORDER — LACTATED RINGERS IV SOLN
500.0000 mL | Freq: Once | INTRAVENOUS | Status: AC
  Administered 2020-05-10: 500 mL via INTRAVENOUS

## 2020-05-10 NOTE — Progress Notes (Signed)
Awaiting for anesthesia to come to room for epidural

## 2020-05-10 NOTE — Anesthesia Preprocedure Evaluation (Signed)

## 2020-05-10 NOTE — Progress Notes (Signed)
Labor Progress Note Christy Melendez is a 25 y.o. G1P0 at [redacted]w[redacted]d presented for IOL-4/8 BPP. S: Doing well without complaints.  O:  BP 111/62   Pulse 88   Temp 98.1 F (36.7 C) (Oral)   Resp 16   Ht 5\' 6"  (1.676 m)   Wt 98.2 kg   LMP 08/09/2019   SpO2 99%   BMI 34.96 kg/m  EFM: baseline 130bpm/mod variability/+ accels/no decels Toco: q5 min  CVE: Dilation: 1.5 Effacement (%): 50 Cervical Position: Middle Station: -2 Presentation: Vertex Exam by:: [email or web address] RN   A&P: 25 y.o. G1P0 [redacted]w[redacted]d presented for IOL-4/8 BPP. #IOL: Presented to clinic 4/8 and was found to have 4/8 BPP (breathing, movement). Decision made for IOL at that time. S/p cyto x3. Given cervical exam will start pitocin after patient eats breakfast. #Pain: epidural #FWB: cat 1 #GBS positive, PCN allergy, vanc #marginal cord insert: placenta to path #Contraception: desires post placental IUD, previously ordered. Discussed risks/benefits with patient and consents signed.  6/8, MD 6:37 AM

## 2020-05-10 NOTE — Progress Notes (Signed)
Labor Progress Note Christy Melendez is a 25 y.o. G1P0 at [redacted]w[redacted]d presented for IOL for BPP 4/8  S:  Comfortable with epidural  O:  BP (!) 103/55   Pulse 75   Temp 98.1 F (36.7 C) (Oral)   Resp 18   Ht 5\' 6"  (1.676 m)   Wt 98.2 kg   LMP 08/09/2019   SpO2 100%   BMI 34.96 kg/m  EFM: baseline 130 bpm/ mod variability/ + accels/ variable decels  Toco/IUPC: q2-3/ MVU 220 SVE: Dilation: 6 Effacement (%): 100 Cervical Position: Middle Station: -1 Presentation: Vertex Exam by:: [email or web address] Pitocin: 14 mu/min  A/P: 26 y.o. G1P0 [redacted]w[redacted]d  1. Labor: active, good progress 2. FWB: Cat II 3. Pain: epidural   Amnioinfusion for variables, FHR improving. Continue Pitocin. Anticipate labor progress and SVD.  [redacted]w[redacted]d, CNM 7:00 PM

## 2020-05-10 NOTE — Anesthesia Procedure Notes (Signed)
Epidural Patient location during procedure: OB Start time: 05/10/2020 12:30 PM End time: 05/10/2020 12:43 PM  Staffing Anesthesiologist: Lowella Curb, MD Performed: anesthesiologist   Preanesthetic Checklist Completed: patient identified, IV checked, site marked, risks and benefits discussed, surgical consent, monitors and equipment checked, pre-op evaluation and timeout performed  Epidural Patient position: sitting Prep: ChloraPrep Patient monitoring: heart rate, cardiac monitor, continuous pulse ox and blood pressure Approach: midline Location: L2-L3 Injection technique: LOR saline  Needle:  Needle type: Tuohy  Needle gauge: 17 G Needle length: 9 cm Needle insertion depth: 6 cm Catheter type: closed end flexible Catheter size: 20 Guage Catheter at skin depth: 10 cm Test dose: negative  Assessment Events: blood not aspirated, injection not painful, no injection resistance, no paresthesia and negative IV test  Additional Notes Reason for block:procedure for pain

## 2020-05-10 NOTE — Progress Notes (Signed)
Labor Progress Note Christy Melendez is a 25 y.o. G1P0 at [redacted]w[redacted]d presented for IOL-4/8 BPP. S: Feeling increased pressure with contractions.  O:  BP 120/72   Pulse 83   Temp 98.5 F (36.9 C) (Oral)   Resp 16   Ht 5\' 6"  (1.676 m)   Wt 98.2 kg   LMP 08/09/2019   SpO2 100%   BMI 34.96 kg/m  EFM: baseline 130bpm/mod variability/+ accels/no decels Toco: q1-3 min  CVE: Dilation: 10 Dilation Complete Date: 05/10/20 Dilation Complete Time: 2232 Effacement (%): 100 Cervical Position: Middle Station: Plus 1 Presentation: Vertex Exam by:: Dr. [email or web address]   A&P: 25 y.o. G1P0 [redacted]w[redacted]d presented for IOL-4/8 BPP. #IOL: Presented to clinic 4/8 and was found to have 4/8 BPP (breathing, movement). Decision made for IOL at that time. S/p cyto x3. Pit started @0820 . SROM@0920 . Pitocin currently @20mL /hr, patient complete. Will start pushing. #Pain: epidural #FWB: cat 1 #GBS positive, PCN allergy, vanc #marginal cord insert: placenta to path #Contraception: desires post placental IUD, previously ordered. Discussed risks/benefits with patient and consents signed.  6/8, MD 10:35 PM

## 2020-05-10 NOTE — Progress Notes (Signed)
Pt returned to bed.

## 2020-05-10 NOTE — Progress Notes (Signed)
RN at bedside since 1146 unable to keep consistent fhr tracing due to maternal movement and unable to trace uc's

## 2020-05-10 NOTE — Progress Notes (Signed)
Labor Progress Note Christy Melendez is a 25 y.o. G1P0 at [redacted]w[redacted]d presented for IOL for BPP 4/8.  S:  Starting to feel ctx q4 min.   O:  BP 129/67   Pulse 82   Temp 98 F (36.7 C) (Oral)   Resp 19   Ht 5\' 6"  (1.676 m)   Wt 98.2 kg   LMP 08/09/2019   SpO2 99%   BMI 34.96 kg/m  EFM: baseline 125 bpm/ mod variability/ + accels/ no decels  Toco/IUPC: undeterminable SVE: Dilation: 2 Effacement (%): 70 Cervical Position: Middle Station: -2 Presentation: Vertex Exam by:: B Boyer RN Pitocin: 2 mu/min  A/P: 25 y.o. G1P0 [redacted]w[redacted]d  1. Labor: latent 2. FWB: Cat I 3. Pain: analgesia/anesthesia prn 4. GBS pos> Vanc (hx hives with PCN)  S/p Cytotec. Pitocin just started. Consider AROM when indicated. Anticipate labor progress and SVD.  [redacted]w[redacted]d, CNM 9:10 AM

## 2020-05-10 NOTE — Progress Notes (Signed)
Labor Progress Note Christy Melendez is a 25 y.o. G1P0 at [redacted]w[redacted]d presented for IOL for BPP 4/8.  S:  Feeling more uncomfortable with ctx, requesting epidural.  O:  BP 117/76   Pulse 72   Temp 98.2 F (36.8 C) (Oral)   Resp 18   Ht 5\' 6"  (1.676 m)   Wt 98.2 kg   LMP 08/09/2019   SpO2 100%   BMI 34.96 kg/m  EFM: baseline 125 bpm/ mod variability/ + accels/ early decels  Toco/IUPC: 2-3 SVE: Dilation: 5 Effacement (%): 90 Cervical Position: Middle Station: 0 Presentation: Vertex Exam by:: [email or web address] Pitocin: 10 mu/min SROM, clear @0920   A/P: 25 y.o. G1P0 [redacted]w[redacted]d  1. Labor: early active 2. FWB: Cat I 3. Pain: epidural per request  Continue Pitocin. Anticipate labor progress and SVD.  30, CNM 1:44 PM

## 2020-05-10 NOTE — Progress Notes (Signed)
Pt returned to bed from bathroom °

## 2020-05-10 NOTE — Progress Notes (Signed)
Labor Progress Note Christy Melendez is a 25 y.o. G1P0 at [redacted]w[redacted]d presented for IOL-4/8 BPP. S: Strip reviewed.  O:  BP 111/62   Pulse 88   Temp 98.1 F (36.7 C) (Oral)   Resp 16   Ht 5\' 6"  (1.676 m)   Wt 98.2 kg   LMP 08/09/2019   SpO2 99%   BMI 34.96 kg/m  EFM: baseline 120bpm/mod variability/+ accels/no decels Toco: quiet  CVE: Dilation: 1.5 Effacement (%): 50 Cervical Position: Middle Station: -2 Presentation: Vertex Exam by:: [email or web address] RN   A&P: 25 y.o. G1P0 [redacted]w[redacted]d presented for IOL-4/8 BPP. #IOL: Presented to clinic 4/8 and was found to have 4/8 BPP (breathing, movement). Decision made for IOL at that time. S/p cyto x2. RN checked patient and re-dosed cytotec. Consider repeat FB attempt/cytotec vs starting pitocin at next check. #Pain: PRN, desires epidural #FWB: cat 1 #GBS positive, PCN allergy, vanc #marginal cord insert: placenta to path #Contraception: desires post placental IUD, previously ordered. Discussed risks/benefits with patient and consents signed.  6/8, MD 5:13 AM

## 2020-05-11 ENCOUNTER — Encounter (HOSPITAL_COMMUNITY): Payer: Self-pay | Admitting: Family Medicine

## 2020-05-11 DIAGNOSIS — Z30430 Encounter for insertion of intrauterine contraceptive device: Secondary | ICD-10-CM | POA: Diagnosis not present

## 2020-05-11 DIAGNOSIS — O368130 Decreased fetal movements, third trimester, not applicable or unspecified: Secondary | ICD-10-CM

## 2020-05-11 DIAGNOSIS — Z3A38 38 weeks gestation of pregnancy: Secondary | ICD-10-CM

## 2020-05-11 DIAGNOSIS — O99824 Streptococcus B carrier state complicating childbirth: Secondary | ICD-10-CM

## 2020-05-11 DIAGNOSIS — Z975 Presence of (intrauterine) contraceptive device: Secondary | ICD-10-CM

## 2020-05-11 LAB — GLUCOSE, CAPILLARY: Glucose-Capillary: 100 mg/dL — ABNORMAL HIGH (ref 70–99)

## 2020-05-11 MED ORDER — PRENATAL MULTIVITAMIN CH
1.0000 | ORAL_TABLET | Freq: Every day | ORAL | Status: DC
  Administered 2020-05-11 – 2020-05-12 (×2): 1 via ORAL
  Filled 2020-05-11 (×2): qty 1

## 2020-05-11 MED ORDER — DIBUCAINE (PERIANAL) 1 % EX OINT: 1.0000 "application " | TOPICAL_OINTMENT | CUTANEOUS | Status: DC | PRN

## 2020-05-11 MED ORDER — IBUPROFEN 600 MG PO TABS
600.0000 mg | ORAL_TABLET | Freq: Four times a day (QID) | ORAL | Status: DC
  Administered 2020-05-11 – 2020-05-12 (×6): 600 mg via ORAL
  Filled 2020-05-11 (×6): qty 1

## 2020-05-11 MED ORDER — BENZOCAINE-MENTHOL 20-0.5 % EX AERO: 1.0000 "application " | INHALATION_SPRAY | CUTANEOUS | Status: DC | PRN

## 2020-05-11 MED ORDER — TRANEXAMIC ACID-NACL 1000-0.7 MG/100ML-% IV SOLN
INTRAVENOUS | Status: AC
  Administered 2020-05-11: 1000 mg
  Filled 2020-05-11: qty 100

## 2020-05-11 MED ORDER — SIMETHICONE 80 MG PO CHEW: 80.0000 mg | CHEWABLE_TABLET | ORAL | Status: DC | PRN

## 2020-05-11 MED ORDER — SENNOSIDES-DOCUSATE SODIUM 8.6-50 MG PO TABS
2.0000 | ORAL_TABLET | ORAL | Status: DC
  Administered 2020-05-11 – 2020-05-12 (×2): 2 via ORAL
  Filled 2020-05-11 (×2): qty 2

## 2020-05-11 MED ORDER — DIPHENHYDRAMINE HCL 25 MG PO CAPS: 25.0000 mg | ORAL_CAPSULE | Freq: Four times a day (QID) | ORAL | Status: DC | PRN

## 2020-05-11 MED ORDER — WITCH HAZEL-GLYCERIN EX PADS: 1.0000 "application " | MEDICATED_PAD | CUTANEOUS | Status: DC | PRN

## 2020-05-11 MED ORDER — ACETAMINOPHEN 325 MG PO TABS: 650.0000 mg | ORAL_TABLET | ORAL | Status: DC | PRN

## 2020-05-11 MED ORDER — ONDANSETRON HCL 4 MG PO TABS: 4.0000 mg | ORAL_TABLET | ORAL | Status: DC | PRN

## 2020-05-11 MED ORDER — COCONUT OIL OIL: 1.0000 "application " | TOPICAL_OIL | Status: DC | PRN

## 2020-05-11 MED ORDER — MEASLES, MUMPS & RUBELLA VAC IJ SOLR: 0.5000 mL | Freq: Once | INTRAMUSCULAR | Status: DC

## 2020-05-11 MED ORDER — TRANEXAMIC ACID-NACL 1000-0.7 MG/100ML-% IV SOLN: 1000.0000 mg | INTRAVENOUS | Status: DC

## 2020-05-11 MED ORDER — TETANUS-DIPHTH-ACELL PERTUSSIS 5-2.5-18.5 LF-MCG/0.5 IM SUSY: 0.5000 mL | PREFILLED_SYRINGE | Freq: Once | INTRAMUSCULAR | Status: DC

## 2020-05-11 MED ORDER — ONDANSETRON HCL 4 MG/2ML IJ SOLN: 4.0000 mg | INTRAMUSCULAR | Status: DC | PRN

## 2020-05-11 NOTE — Lactation Note (Signed)
This note was copied from a baby's chart. Lactation Consultation Note  Patient Name: Christy Melendez [id number] Date: 05/11/2020 Reason for consult: Initial assessment;Early term 37-38.6wks;Primapara;1st time breastfeeding Age:27 hours   P1 mother whose infant is now 50 hours old.  This is an ETI at 38+6 weeks.  Mother's feeding preference is breast.  Baby was swaddled and asleep when I arrived.  Mother had recently breast fed.  Reviewed breast feeding basics with her.  She is familiar with hand expression.  Mother reported that baby is sleepy and she is having a little bit of difficulty with latching.  Reassured her that sleepiness at this age is typical behavior and encouraged her to call me for the next feeding.  I would like to assist with latching and provide further education.  Mother verbalized understanding and will call for assistance.    Mom made aware of O/P services, breastfeeding support groups, community resources, and our phone # for post-discharge questions. Support person asleep on the couch.   Maternal Data Has patient been taught Hand Expression?: Yes Does the patient have breastfeeding experience prior to this delivery?: No  Feeding Mother's Current Feeding Choice: Breast Milk  LATCH Score                    Lactation Tools Discussed/Used    Interventions    Discharge    Consult Status Consult Status: Follow-up Date: 05/12/20 Follow-up type: In-patient    Christy Melendez 05/11/2020, 7:51 AM

## 2020-05-11 NOTE — Lactation Note (Signed)
This note was copied from a baby's chart. Lactation Consultation Note  Patient Name: Christy Melendez [id number] Date: 05/11/2020 Reason for consult: Follow-up assessment Age:27 hours  P1 mother whose infant is now 34 hours old.  This is an ETI at 38+6 weeks.  Mother requested latch assistance.  Mother stated that baby was showing feeding cues, however, he was asleep when I arrived.  Baby had on a onesie and was wrapped in a fleece blanket.  Asked permission to remove blanket and clothing.  Taught mother hand expression and she was able to express 1 ml of colostrum which I finger fed back to baby.  He remained sleepy.  Attempted to latch in the cross cradle hold to the right breast, however, he was too sleepy to awaken.  Placed back in his bassinet where he remained sleeping.  Provided a manual pump for mother with instructions for use.  Changed the #24 flange size to a #27 flange size for better comfort and fit.  Mother did a return demonstration.  She will pump and feed back any EBM she obtains to baby.  Father present.  RN updated.   Maternal Data    Feeding    LATCH Score Latch: Too sleepy or reluctant, no latch achieved, no sucking elicited.  Audible Swallowing: None  Type of Nipple: Everted at rest and after stimulation  Comfort (Breast/Nipple): Soft / non-tender  Hold (Positioning): Assistance needed to correctly position infant at breast and maintain latch.  LATCH Score: 5   Lactation Tools Discussed/Used    Interventions Interventions: Breast feeding basics reviewed;Skin to skin;Breast massage;Hand express;Pre-pump if needed;Adjust position;Hand pump;Position options;Support pillows;Education  Discharge Pump: Manual  Consult Status Consult Status: Follow-up Date: 05/12/20 Follow-up type: In-patient    Kimberlea Schlag R Jeannett Dekoning 05/11/2020, 1:26 PM

## 2020-05-11 NOTE — Anesthesia Postprocedure Evaluation (Signed)
Anesthesia Post Note  Patient: Christy Melendez  Procedure(s) Performed: AN AD HOC LABOR EPIDURAL     Patient location during evaluation: Mother Baby Anesthesia Type: Epidural Level of consciousness: awake and alert and oriented Pain management: satisfactory to patient Vital Signs Assessment: post-procedure vital signs reviewed and stable Respiratory status: spontaneous breathing and nonlabored ventilation Cardiovascular status: stable and unstable Postop Assessment: no headache, no backache, no signs of nausea or vomiting, adequate PO intake and patient able to bend at knees (patient up walking) Anesthetic complications: no   No complications documented.  Last Vitals:  Vitals:   05/11/20 0455 05/11/20 0853  BP: 112/68 94/66  Pulse: 85 82  Resp: 18 17  Temp: 36.9 C 36.7 C  SpO2: 100% 99%    Last Pain:  Vitals:   05/11/20 0853  TempSrc: Oral  PainSc: 0-No pain   Pain Goal:                   Madison Hickman

## 2020-05-11 NOTE — Discharge Instructions (Signed)
Parto vaginal, cuidados de puerperio Postpartum Care After Vaginal Delivery La siguiente informacin ofrece una gua sobre cmo cuidarse desde el momento en que nazca su beb y hasta 6 a 12 semanas despus del parto (perodo del posparto). Si tiene problemas o preguntas, pngase en contacto con su mdico para obtener instrucciones ms especficas. Siga estas instrucciones en su casa: Hemorragia vaginal  Es normal tener un poco de hemorragia vaginal (loquios) despus del parto. Use un apsito sanitario para el sangrado y secrecin. ? Durante la primera semana despus del parto, la cantidad y el aspecto de los loquios a menudo es similar a los del perodo menstrual. ? Durante las siguientes semanas disminuir gradualmente hasta convertirse en una secrecin seca amarronada o amarillenta. ? En la mayora de las mujeres, los loquios se detienen completamente entre 4 a 6semanas despus del parto, pero esto puede variar.  Cambie los apsitos sanitarios con frecuencia. Observe si hay cambios en el flujo, como: ? Aumento repentino en el volumen. ? Cambio en el color. ? Cogulos de sangre grandes.  Si expulsa un cogulo de sangre por la vagina, gurdelo y llame al mdico. No deseche los cogulos de sangre por el inodoro antes de hablar con su mdico.  No use tampones ni se haga duchas vaginales hasta que el mdico la autorice.  Si no est amamantando, volver a tener su perodo entre 6 y 8 semanas despus del parto. Si solamente alimenta al beb con leche materna, podra no volver a tener su perodo hasta que deje de amamantar. Cuidados perineales  Mantenga la zona entre la vagina y el ano (perineo) limpia y seca. Utilice apsitos o aerosoles analgsicos y cremas, como se lo hayan indicado.  Si le hicieron un corte quirrgico en el perineo (episiotoma) o tuvo un desgarro, controle la zona para detectar signos de infeccin hasta que sane. Est atenta a los siguientes signos: ? Aumento del  enrojecimiento, la hinchazn o el dolor. ? Presenta lquido o sangre que supura del corte o desgarro. ? Calor. ? Pus o mal olor.  Es posible que le den una botella rociadora para que use en lugar de limpiarse el rea con papel higinico despus de usar el bao. Seque la zona dando golpecitos suaves.  Para aliviar el dolor causado por una episiotoma, un desgarro o venas hinchadas en el ano (hemorroides), tome un bao de asiento tibio 2 o 3 veces por da. En un bao de asiento, el agua tibia solamente debe llegar hasta las caderas y cubrir las nalgas.   Cuidado de las mamas  En los primeros das despus del parto, las mamas pueden sentirse pesadas, llenas e incmodas (congestin mamaria). Tambin puede escaparse leche de sus senos. Pdale al mdico que le sugiera formas de aliviar el malestar.  Si est amamantando: ? Use un sostn que sujete las mamas y ajuste bien. Utilice protectores mamarios para absorber la leche que se filtre. ? Mantenga los pezones secos y limpios. Aplique cremas y ungentos como se lo hayan indicado. ? Puede tener contracciones uterinas cada vez que amamante durante varias semanas despus del parto. Esto ayuda a que el tero vuelva a su tamao normal. ? Si tiene algn problema con la lactancia materna, infrmelo al mdico o a un asesor en lactancia.  Si no est amamantando: ? Evite tocarse las mamas. No extraiga (saque) leche materna. Al hacerlo, podran producir ms leche. ? Use un sostn que le proporcione el ajuste correcto y compresas fras para reducir la hinchazn. Intimidad y sexualidad    Pregntele al mdico cundo puede retomar la actividad sexual. Esto puede depender de lo siguiente: ? Su riesgo de sufrir infecciones. ? La rapidez con la que est sanando. ? Su comodidad y deseo de retomar la actividad sexual.  Despus del parto, puede quedar embarazada incluso si no ha tenido todava su perodo. Hable con el mdico acerca de los mtodos de control de la  natalidad (mtodos anticonceptivos) o planificacin familiar si desea tener embarazos en el futuro. Medicamentos  Use los medicamentos de venta libre y los recetados solamente como se lo haya indicado el mdico.  Tome un laxante de venta libre para ayudar con las deposiciones como se lo haya indicado el mdico.  Si le recetaron un antibitico, tmelo como se lo haya indicado el mdico. No deje de tomar el antibitico aunque comience a sentirse mejor.  Revise todos los medicamentos con receta anteriores y actuales para comprobar la posible transferencia a la leche materna. Actividad  Retome sus actividades normales de a poco segn lo indicado por el mdico.  Descanse todo lo que pueda. Tome siestas mientras el beb duerme. Comida y bebida  Beba suficiente lquido como para mantener la orina de color amarillo plido.  Para ayudar a prevenir o aliviar el estreimiento, coma alimentos ricos en fibra todos los das.  Elija una alimentacin saludable para ayudar a la lactancia o los objetivos de prdida de peso.  Tome sus vitaminas prenatales hasta que su mdico le indique que deje de hacerlo.   Recomendaciones/consejos generales  No consuma ningn producto que contenga nicotina o tabaco. Estos productos incluyen cigarrillos, tabaco para mascar y aparatos de vapeo, como los cigarrillos electrnicos. Si necesita ayuda para dejar de fumar, consulte al mdico.  No beba alcohol, especialmente si est amamantando.  No tome medicamentos o frmacos que no se le receten, especialmente si est en perodo de lactancia.  Visite al mdico para un control de posparto dentro de las primeras 3 a 6 semanas despus del parto.  Realice una visita posparto integral a ms tardar 12 semanas despus del parto.  Asista a todas las visitas de seguimiento para usted y su beb. Comunquese con un mdico si:  Siente tristeza o preocupacin de forma inusual.  Las mamas se ponen rojas, le duelen o se  endurecen.  Tiene fiebre u otros signos de infeccin.  Tiene sangrado que est empapando una compresa por hora o tiene cogulos de sangre.  Siente un dolor de cabeza intenso que no se alivia o tiene cambios en la visin.  Tiene nuseas y vmitos y no puede comer o beber nada durante 24horas. Solicite ayuda de inmediato si:  Tiene dolor en el pecho o dificultad para respirar.  Tiene un dolor repentino e intenso en la pierna.  Tiene una convulsin o se desmaya.  Tiene pensamientos acerca de lastimarse a usted misma o a su beb. Si alguna vez siente que puede lastimarse o lastimar a otras personas, o tiene pensamientos de poner fin a su vida, busque ayuda de inmediato. Dirjase al servicio de urgencias ms cercano o:  Comunquese con el servicio de emergencias de su localidad (911 en los [place]).  National Suicide Prevention Lifeline (Lnea Telefnica Nacional para la Prevencin del Suicidio) al 1-800-273-8255. Esta lnea de asistencia al suicida est abierta las 24 horas del da.  Enve un mensaje de texto a la lnea para casos de crisis al [id number] (en los [place] Resumen  El perodo de tiempo despus el parto y hasta 6 a 12 semanas despus   del parto se denomina perodo posparto.  Asista a todas las visitas de seguimiento para usted y su beb.  Revise todos los medicamentos con receta anteriores y actuales para comprobar la posible transferencia a la leche materna.  Pngase en contacto con un mdico si se siente inusualmente triste o preocupada durante el perodo posparto. Esta informacin no tiene como fin reemplazar el consejo del mdico. Asegrese de hacerle al mdico cualquier pregunta que tenga. Document Revised: 11/22/2019 Document Reviewed: 10/30/2019 Elsevier Patient Education  2021 Elsevier Inc.  

## 2020-05-11 NOTE — Procedures (Signed)
  Post-Placental IUD Insertion Procedure Note  Patient identified, informed consent signed prior to delivery, signed copy in chart, time out was performed.    Vaginal, labial and perineal areas thoroughly inspected for lacerations. Second degree laceration identified and sulcal laceration - not hemostatic, not repaired prior to insertion of IUD.  Liletta  - IUD grasped between sterile gloved fingers. Sterile lubrication applied to sterile gloved hand for ease of insertion. Fundus identified through abdominal wall using non-insertion hand. IUD inserted to fundus with bimanual technique. IUD carefully released at the fundus and insertion hand gently removed from vagina.    Strings trimmed to the level of the introitus. Patient tolerated procedure well.  Patient given post procedure instructions and IUD care card with expiration date.  Patient is asked to keep IUD strings tucked in her vagina until her postpartum follow up visit in 4-6 weeks. Patient advised to abstain from sexual intercourse and pulling on strings before her follow-up visit. Patient verbalized an understanding of the plan of care and agrees.   Alric Seton, MD OB Fellow, [employer], [facility] 05/11/2020 2:24 AM

## 2020-05-11 NOTE — Discharge Summary (Signed)
Postpartum Discharge Summary  Date of Service updated     Patient Name: Christy Melendez DOB: 1995/10/16 MRN: [id number]  Date of admission: 05/09/2020 Delivery date:05/11/2020  Delivering provider: Arrie Senate  Date of discharge: 05/12/2020  Admitting diagnosis: Encounter for induction of labor [Z34.90] Intrauterine pregnancy: [redacted]w[redacted]d    Secondary diagnosis:  Active Problems:   Supervision of other normal pregnancy, antepartum   Group beta Strep positive   Chlamydia   Marginal insertion of umbilical cord affecting management of mother   Penicillin allergy   Encounter for induction of labor   Vaginal delivery   IUD (intrauterine device) in place  Additional problems: none    Discharge diagnosis: Term Pregnancy Delivered                                              Post partum procedures:post placental liletta placed Augmentation: Pitocin and Cytotec Complications: None  Hospital course: Induction of Labor With Vaginal Delivery   25y.o. yo G1P0 at 313w6das admitted to the hospital 05/09/2020 for induction of labor.  Indication for induction: BPP 4/8 (breathing, movement).  Patient had an uncomplicated labor course as follows: Membrane Rupture Time/Date: 9:10 AM ,05/10/2020   Delivery Method:Vaginal, Spontaneous  Episiotomy: None  Lacerations:  Sulcus  Details of delivery can be found in separate delivery note.  Patient had a routine postpartum course. Patient is discharged home 05/12/20.  Newborn Data: Birth date:05/11/2020  Birth time:12:17 AM  Gender:Female  Living status:Living  Apgars:8 ,8  Weight:3020 g   Magnesium Sulfate received: No BMZ received: No Rhophylac:N/A MMR:N/A T-DaP:Given prenatally Flu: No Transfusion:No  Physical exam  Vitals:   05/11/20 1211 05/11/20 1630 05/11/20 2051 05/12/20 0554  BP: 110/62 (!) 106/58 104/65 114/68  Pulse: 79 88 82 79  Resp: _0 Temp: 98.1 F (36.7 C) 98 F (36.7 C) 98.1 F (36.7 C) 98.2 F (36.8 C)   TempSrc: Oral Oral Oral Oral  SpO2: 98% 99% (!) 87% 100%  Weight:      Height:       General: alert, cooperative and no distress Lochia: appropriate Uterine Fundus: firm Incision: N/A DVT Evaluation: No evidence of DVT seen on physical exam. Labs: Lab Results  Component Value Date   WBC 10.0 05/09/2020   HGB 13.3 05/09/2020   HCT 39.7 05/09/2020   MCV 88.8 05/09/2020   PLT 283 05/09/2020   CMP Latest Ref Rng & Units 11/04/2019  Glucose 70 - 99 mg/dL 94  BUN 6 - 20 mg/dL 8  Creatinine 0.44 - 1.00 mg/dL 0.53  Sodium 135 - 145 mmol/L 132(L)  Potassium 3.5 - 5.1 mmol/L 3.8  Chloride 98 - 111 mmol/L 103  CO2 22 - 32 mmol/L 20(L)  Calcium 8.9 - 10.3 mg/dL 8.9  Total Protein 6.5 - 8.1 g/dL 7.5  Total Bilirubin 0.3 - 1.2 mg/dL 0.2(L)  Alkaline Phos 38 - 126 U/L 36(L)  AST 15 - 41 U/L 15  ALT 0 - 44 U/L 14   Edinburgh Score: Edinburgh Postnatal Depression Scale Screening Tool 05/12/2020  I have been able to laugh and see the funny side of things. 0  I have looked forward with enjoyment to things. 1  I have blamed myself unnecessarily when things went wrong. 2  I have been anxious or worried for no good reason. 2  I have felt scared or panicky for no good reason. 0  Things have been getting on top of me. 0  I have been so unhappy that I have had difficulty sleeping. 1  I have felt sad or miserable. 0  I have been so unhappy that I have been crying. 0  The thought of harming myself has occurred to me. 0  Edinburgh Postnatal Depression Scale Total 6     After visit meds:  Allergies as of 05/12/2020      Reactions   Penicillins Anaphylaxis, Hives      Medication List    TAKE these medications   prenatal vitamin w/FE, FA 27-1 MG Tabs tablet Take 1 tablet by mouth daily at 12 noon.        Discharge home in stable condition Infant Feeding: Breast Infant Disposition:home with mother Discharge instruction: per After Visit Summary and Postpartum booklet. Activity:  Advance as tolerated. Pelvic rest for 6 weeks.  Diet: routine diet Future Appointments: Future Appointments  Date Time Provider [place]  05/14/2020  1:40 PM Aletha Halim, MD [facility] [place]   Follow up Visit: Message sent to [facility] 05/11/20 by Sylvester Harder.   Please schedule this patient for a In person postpartum visit in 6 weeks with the following provider: Any provider. Additional Postpartum F/U:none  Low risk pregnancy complicated by: n/a Delivery mode:  Vaginal, Spontaneous  Anticipated Birth Control:  PP IUD placed, please perform string check at postpartum visit   05/12/2020 Starr [place], CNM

## 2020-05-12 DIAGNOSIS — Z3A38 38 weeks gestation of pregnancy: Secondary | ICD-10-CM

## 2020-05-12 DIAGNOSIS — O99824 Streptococcus B carrier state complicating childbirth: Secondary | ICD-10-CM

## 2020-05-12 DIAGNOSIS — A749 Chlamydial infection, unspecified: Secondary | ICD-10-CM

## 2020-05-12 DIAGNOSIS — O9832 Other infections with a predominantly sexual mode of transmission complicating childbirth: Secondary | ICD-10-CM

## 2020-05-12 DIAGNOSIS — O43893 Other placental disorders, third trimester: Secondary | ICD-10-CM

## 2020-05-12 NOTE — Lactation Note (Signed)
This note was copied from a baby's chart. Lactation Consultation Note  Patient Name: Boy Destynee Stringfellow [id number] Date: 05/12/2020 Reason for consult: Follow-up assessment Age:27 hours   P1 mother whose infant is now 25 hours old.  This is an ETI at 38+6 weeks.    Mother has been exclusively breast feeding.  Last LATCH score was an 8.  Baby is voiding/stooling well.  Mother had some basic breast feeding questions which I answered to her satisfaction.  Reviewed concepts discussed yesterday.    Encouraged to continue feeding on cue.  Reminded mother that frequent feeding and breast stimulation will help encourage a full milk supply.  Suggested she continue hand expression before/after feedings.  Mother verbalized understanding.  Mother questioning introducing an artificial nipple.  I suggested waiting 2-4 weeks to be sure breast feeding is well established if possible.    Engorgement prevention/treatment reviewed.  Mother has her manual pump at bedside and has no questions on use.  She is awaiting her discharge orders and is ready to be discharged.  Mother has our OP phone number for any questions after discharge.     Maternal Data    Feeding    LATCH Score                    Lactation Tools Discussed/Used    Interventions Interventions: Education  Discharge Discharge Education: Engorgement and breast care  Consult Status Consult Status: Complete Date: 05/12/20 Follow-up type: Call as needed    Detria Cummings R Casha Estupinan 05/12/2020, 11:07 AM

## 2020-05-12 NOTE — Progress Notes (Signed)
Nurse Tech did not make Rn aware of the oxygen reading . Unsure if oxygen was charted incorrectly Nurse tech no longer on unit. Patient was breathing unlabored  all night did not have any s/s of sob. Oxygen was 100%  this morning room air.

## 2020-05-13 LAB — SURGICAL PATHOLOGY

## 2020-05-14 ENCOUNTER — Encounter: Payer: Medicaid Other | Admitting: Obstetrics and Gynecology

## 2020-05-29 ENCOUNTER — Telehealth: Payer: Self-pay

## 2020-05-29 NOTE — Telephone Encounter (Signed)
Pt called stating that she is have clots and pain after delivery.  Called pt and pt informed me that she is having vaginal bleeding that she changes a pad 3-4 times a day and she one time clot.  I advised the pt that we would be concerned if she is saturating a pad an hour as she may have bleeding up to 6 weeks after delivery.  I advised that sometimes blood can accumulate and is released as a clot.  I explained to the pt that if she has several clots to please give the office a call.  Pt informs me that she had the IUD placed and the strings are coming out.  I advised the pt that the strings are usually not cut once placed after delivery.  I asked pt if she can come in on 06/11/20 for her pp visit and to get strings checked.  Pt verbalized understanding.   Addison Naegeli, RN  05/29/20

## 2020-06-11 ENCOUNTER — Ambulatory Visit: Payer: Medicaid Other | Admitting: Family Medicine

## 2020-06-23 ENCOUNTER — Ambulatory Visit: Payer: Medicaid Other | Admitting: Nurse Practitioner

## 2020-06-25 ENCOUNTER — Ambulatory Visit (INDEPENDENT_AMBULATORY_CARE_PROVIDER_SITE_OTHER): Payer: Medicaid Other | Admitting: Certified Nurse Midwife

## 2020-06-25 ENCOUNTER — Encounter: Payer: Self-pay | Admitting: Certified Nurse Midwife

## 2020-06-25 ENCOUNTER — Other Ambulatory Visit: Payer: Self-pay

## 2020-06-25 MED ORDER — POLYETHYLENE GLYCOL 3350 17 G PO PACK: 17.0000 g | PACK | Freq: Every day | ORAL | 0 refills | Status: AC

## 2020-06-25 NOTE — Progress Notes (Signed)
Post Partum Visit Note  Christy Melendez is a 25 y.o. G59P1001 female who presents for a postpartum visit. She is 6 weeks 3 days postpartum following a normal spontaneous vaginal delivery.  I have fully reviewed the prenatal and intrapartum course. The delivery was at 38w 4d.  Anesthesia: epidural. Postpartum course has been normal, still having some spotting. Baby is doing well. Baby is feeding by both breast and bottle - Luc[employer] Start Gentle. Bleeding staining only. Bowel function is abnormal: reports some discomfort when stool is hard to pass. Bladder function is normal. Patient is not sexually active. Contraception method is IUD. Postpartum depression screening: negative.  The pregnancy intention screening data noted above was reviewed. Potential methods of contraception were discussed. The patient had a Liletta IUD or IUS post-placental placement.   Edinburgh Postnatal Depression Scale - 06/25/20 1426      Edinburgh Postnatal Depression Scale:  In the Past 7 Days   I have been able to laugh and see the funny side of things. 0    I have looked forward with enjoyment to things. 1    I have blamed myself unnecessarily when things went wrong. 0    I have been anxious or worried for no good reason. 0    I have felt scared or panicky for no good reason. 0    Things have been getting on top of me. 2    I have been so unhappy that I have had difficulty sleeping. 0    I have felt sad or miserable. 2    I have been so unhappy that I have been crying. 1    The thought of harming myself has occurred to me. 0    Edinburgh Postnatal Depression Scale Total 6          Health Maintenance Due  Topic Date Due  . COVID-19 Vaccine (1) Never done   Review of Systems Pertinent items noted in HPI and remainder of comprehensive ROS otherwise negative.  Objective:  BP 108/74   Pulse 89   Wt 197 lb 9.6 oz (89.6 kg)   LMP 08/09/2019   Breastfeeding Yes   BMI 31.89 kg/m    General:  alert,  cooperative, appears stated age and no distress   Breasts:  normal  Lungs: normal effort  Heart:  regular rate and rhythm  Abdomen: soft, non-tender   Wound N/A  GU exam:  normal granulation tissue noted along healing edges of repair, strings trimmed and tucked to left of cervix       Assessment:   Normal postpartum exam.   Plan:   Essential components of care per ACOG recommendations:  1.  Mood and well being: Patient with negative depression screening today. Reviewed local resources for support.  - Patient tobacco use? No.   - hx of drug use? No.    2. Infant care and feeding:  -Patient currently breastmilk feeding? Yes. Reviewed importance of draining breast regularly to support lactation.  -Social determinants of health (SDOH) reviewed in EPIC. No concerns   3. Sexuality, contraception and birth spacing - Patient does not want a pregnancy in the next year.  Desired family size is currently uknown.   - Reviewed forms of contraception in tiered fashion. Patient desired IUD today.   - Discussed birth spacing of 18 months  4. Sleep and fatigue -Encouraged family/partner/community support of 4 hrs of uninterrupted sleep to help with mood and fatigue  5. Physical Recovery  - Discussed  patients delivery and complications. She describes her labor as good. - Patient had a Vaginal, no problems at delivery. Patient had a sulcus laceration. Perineal healing reviewed. Patient expressed understanding - Patient has urinary incontinence? No. - Patient is not safe to resume sexual activity, can resume physical activity. Advised to wait for sexual activity until tenderness to vaginal wall is gone and she can have a bowel movement without pain. Will prescribe miralax to help prevent constipation and straining.  6.  Health Maintenance - HM due items addressed Yes - Last pap smear  Diagnosis  Date Value Ref Range Status  11/13/2019   Final   - Negative for Intraepithelial Lesions or  Malignancy (NILM)  11/13/2019 - Benign reactive/reparative changes  Final   Pap smear not done at today's visit.  -Breast Cancer screening indicated? No.   7. Chronic Disease/Pregnancy Condition follow up: None - PCP follow up as needed  Bernerd Limbo, CNM [facility] [employer], [facility]

## 2021-11-19 IMAGING — US US OB < 14 WEEKS - US OB TV
1 series · 14 of 28 positions shown · non-contrast
Comparison: None.

CLINICAL DATA: Vaginal bleeding.

EXAM:
OBSTETRIC <14 WK US AND TRANSVAGINAL OB US
TECHNIQUE: Both transabdominal and transvaginal ultrasound examinations were
performed for complete evaluation of the gestation as well as the
maternal uterus, adnexal regions, and pelvic cul-de-sac.
Transvaginal technique was performed to assess early pregnancy.

[Series 1: us ob < 14 weeks - us ob tv · 128 acquisitions, 14 frames shown]
[im 5/128]
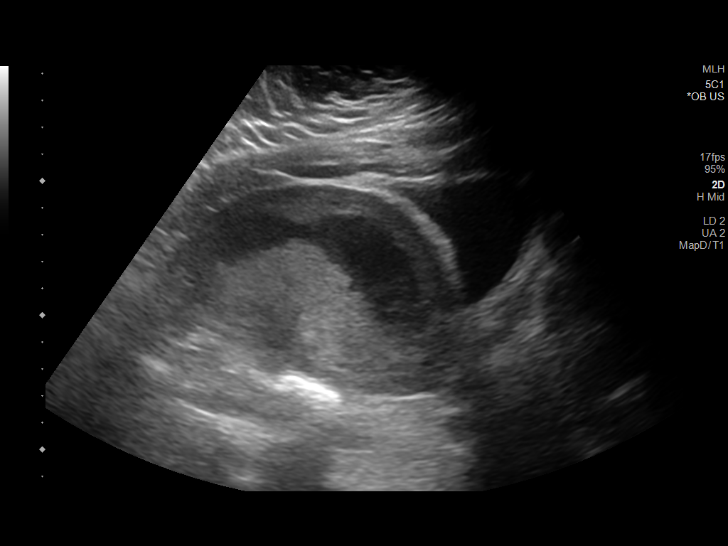
[im 15/128]
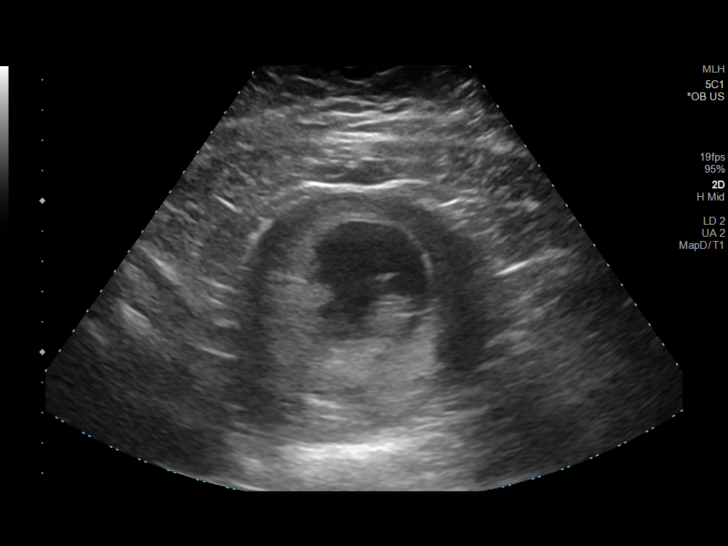
[im 24/128]
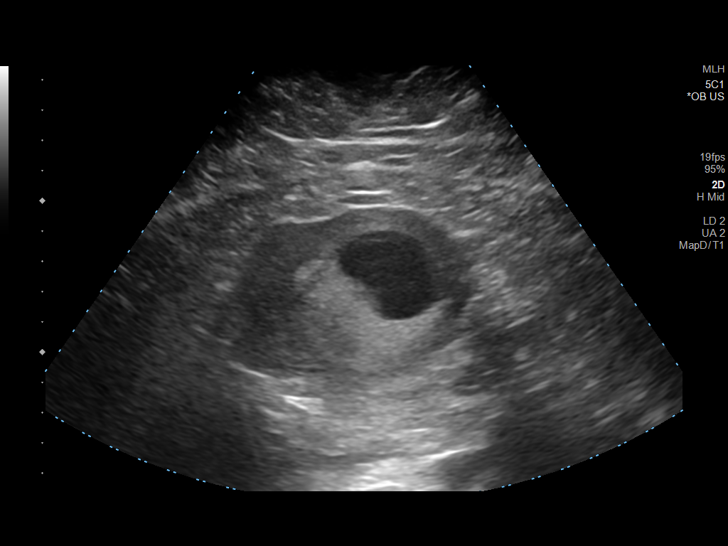
[im 33/128]
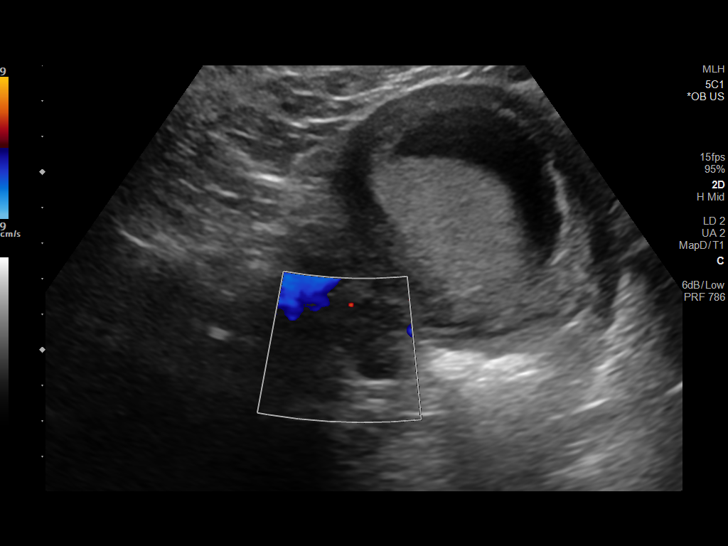
[im 43/128]
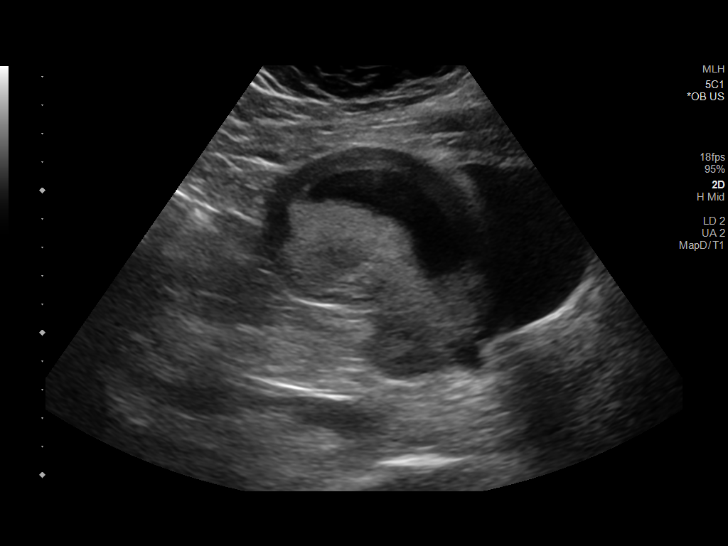
[im 52/128]
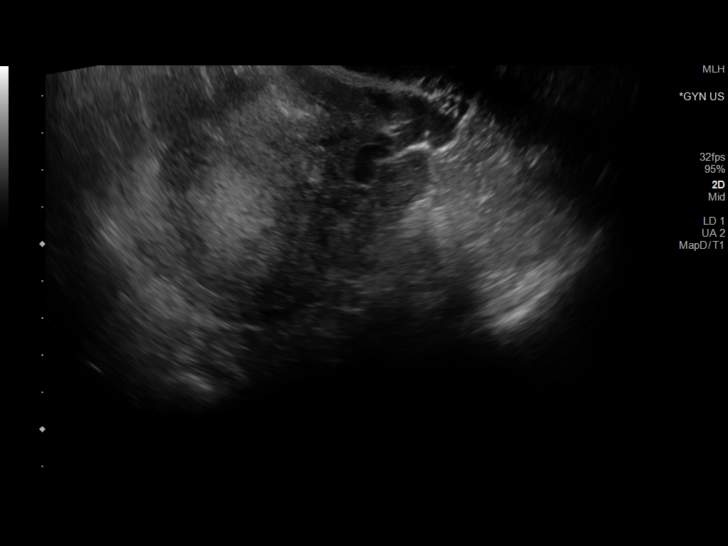
[im 62/128]
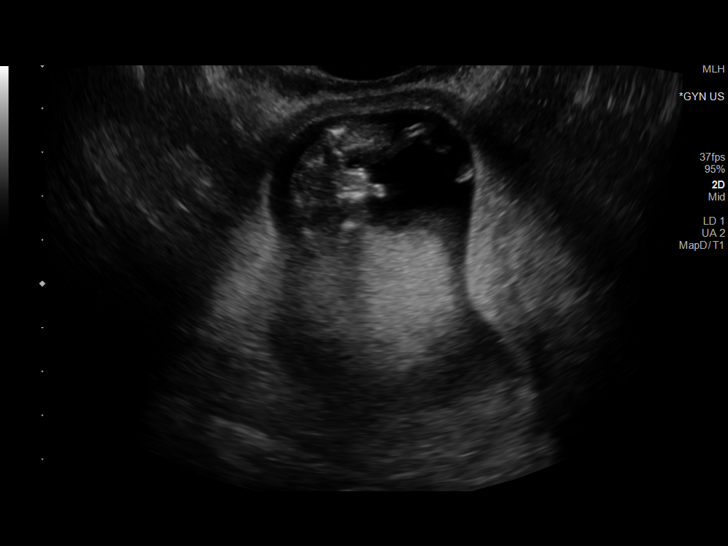
[im 71/128]
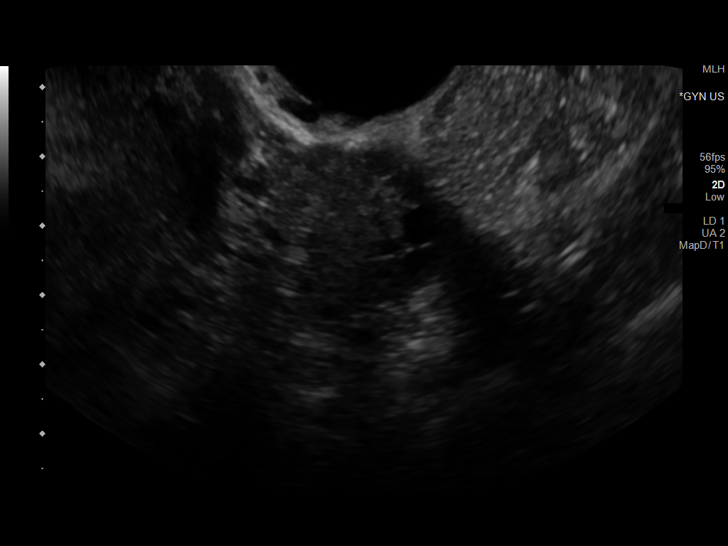
[im 80/128]
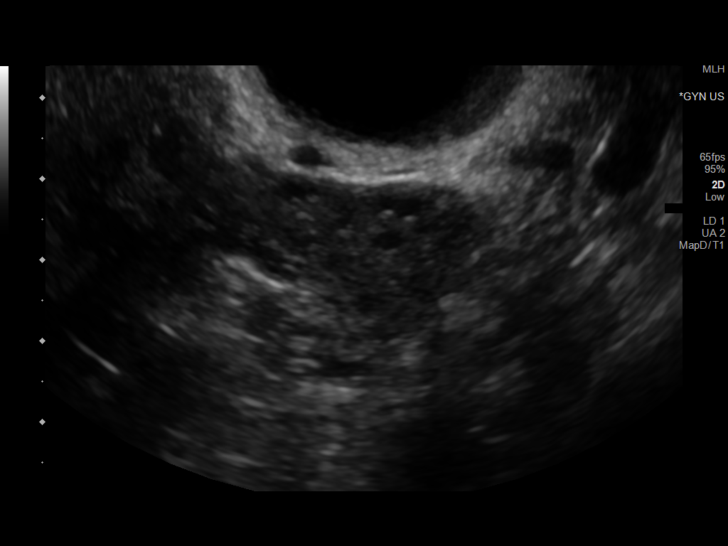
[im 90/128]
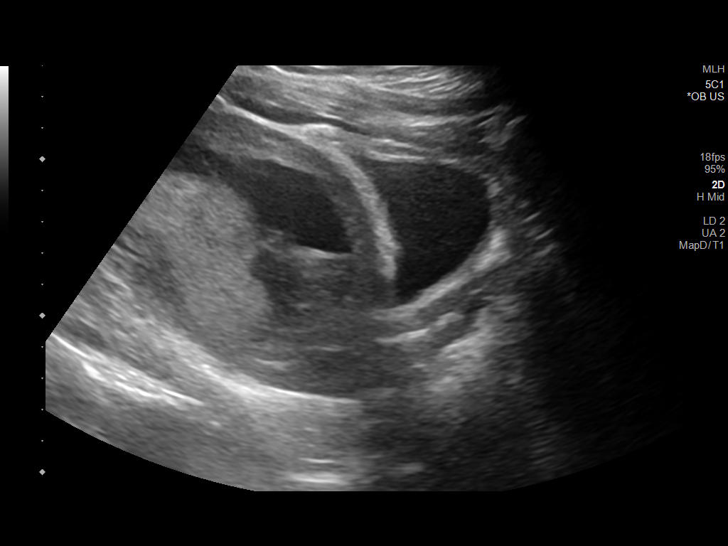
[im 99/128]
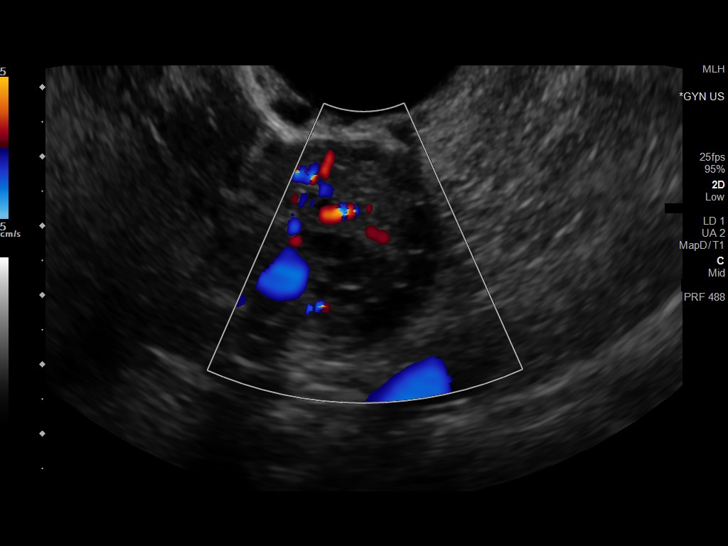
[im 109/128]
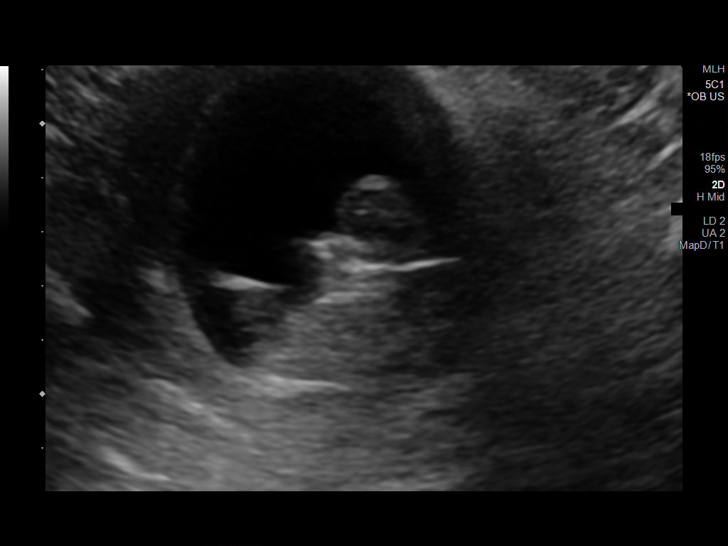
[im 118/128]
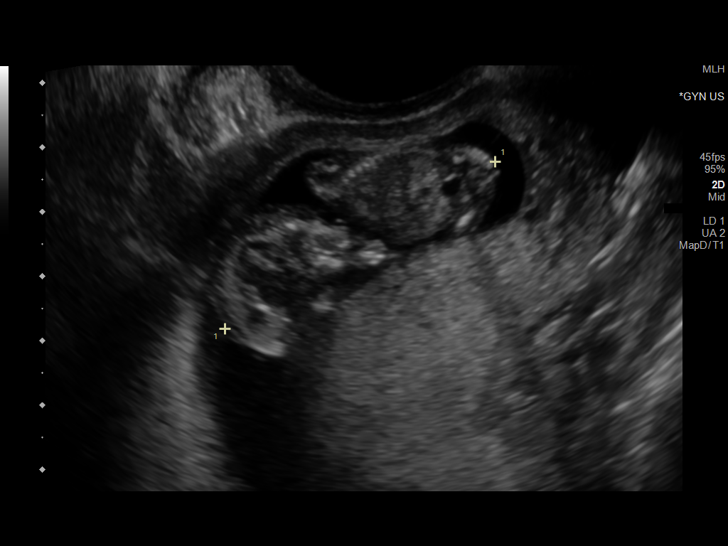
[im 128/128]
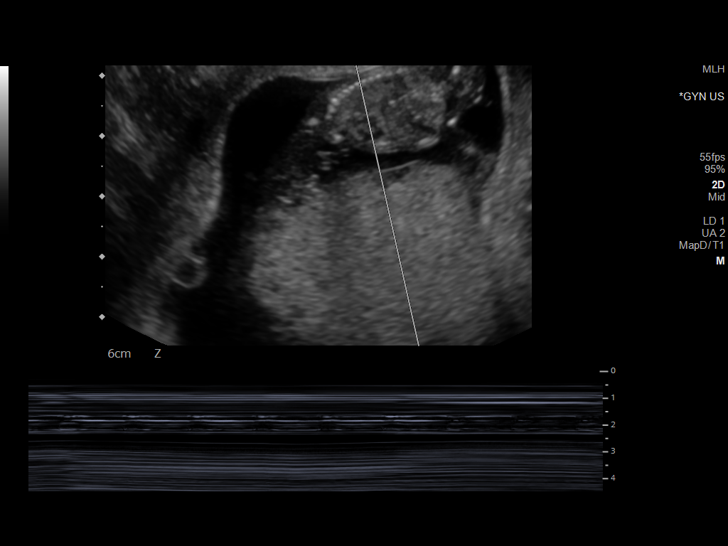

[14 of 28 positions shown; findings below may reference images not displayed]

FINDINGS: Intrauterine gestational sac: Single

Yolk sac:  Not Visualized.

Embryo:  Visualized.

Cardiac Activity: Visualized.

Heart Rate: 160  bpm

CRL: 50.9  mm   11 w   6 d                  US EDC: 05/19/2020

Subchorionic hemorrhage:  None visualized.

Maternal uterus/adnexae: Normal appearance. Probable corpus luteum
in left ovary. No free fluid.
IMPRESSION: Single viable intrauterine pregnancy. No acute abnormality
identified in the pelvis.

## 2022-01-08 IMAGING — US US MFM OB DETAIL+14 WK
1 series · 13 of 28 positions shown · non-contrast
Comparison: none

[Series 2: us mfm ob detail+14 wk · 131 acquisitions, 13 frames shown]
[im 5/131]
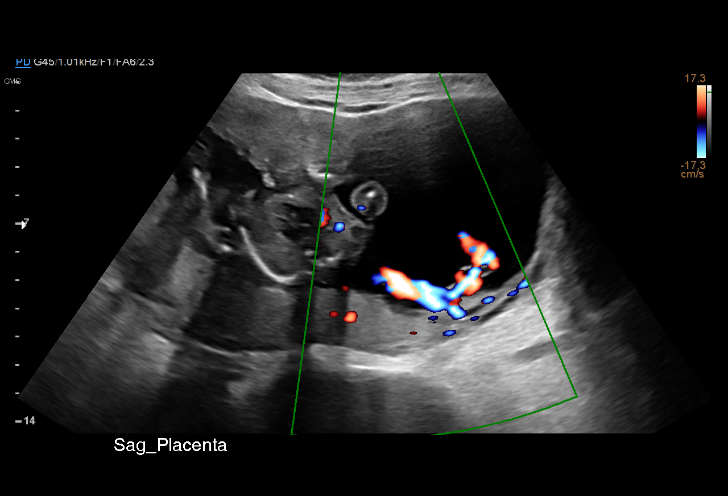
[im 15/131]
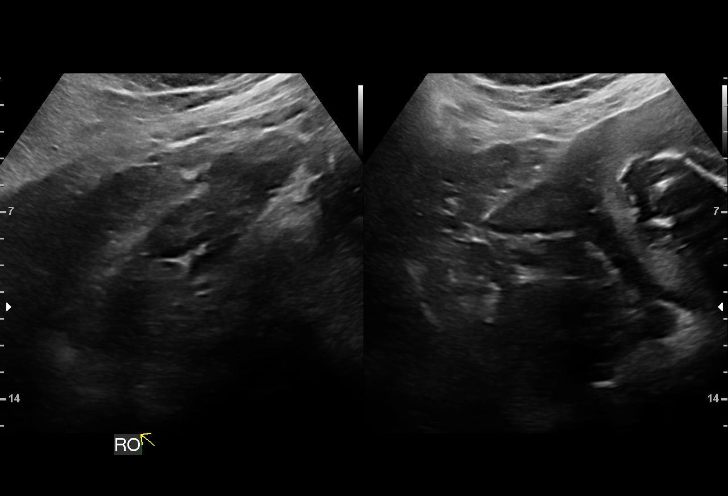
[im 25/131]
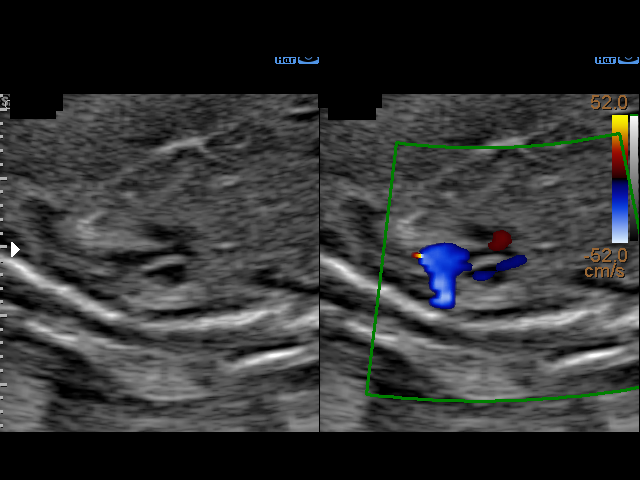
[im 34/131]
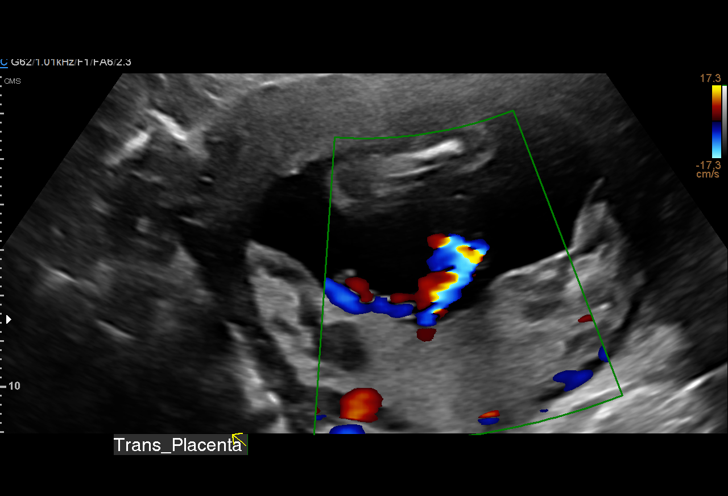
[im 44/131]
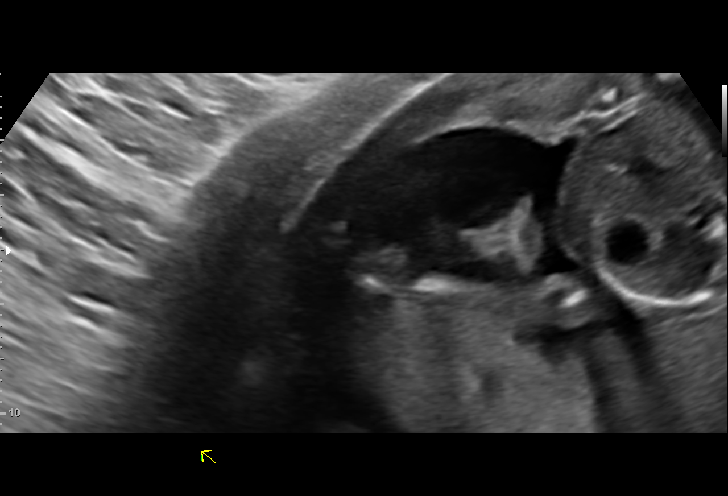
[im 53/131]
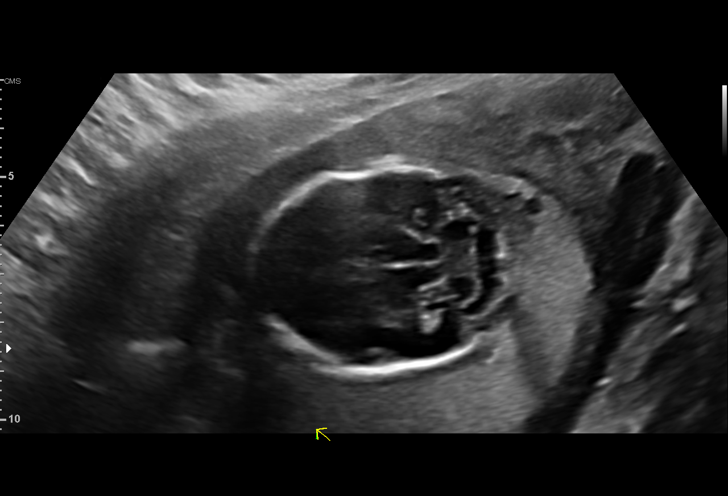
[im 68/131]
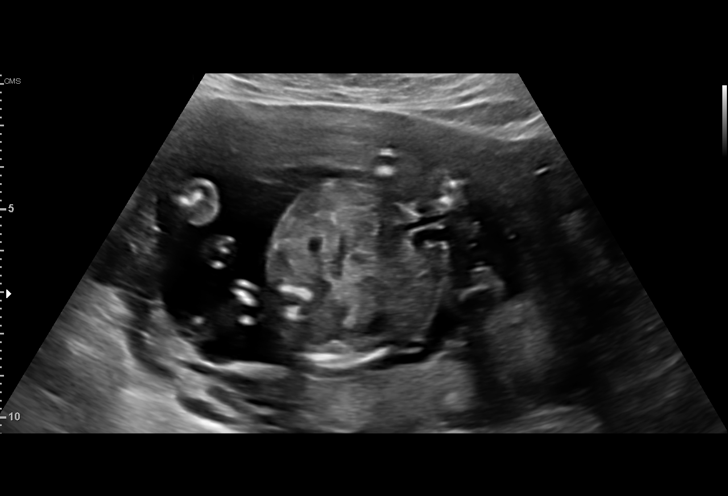
[im 78/131]
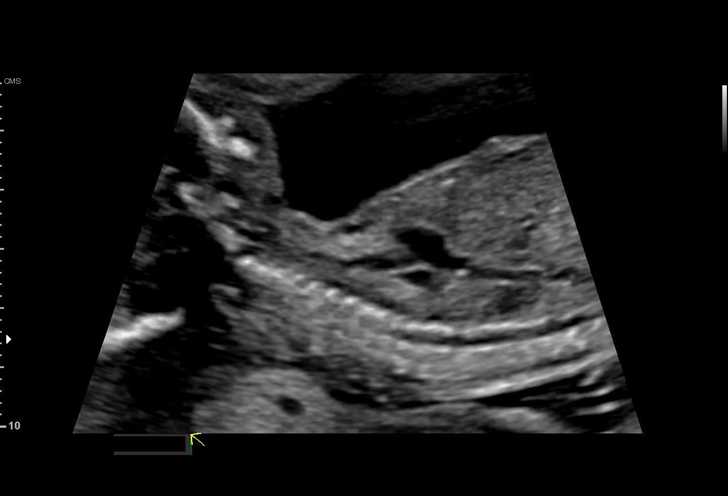
[im 87/131]
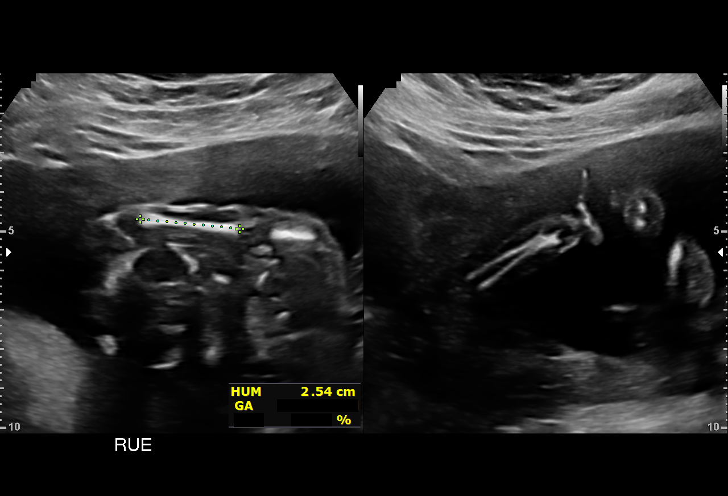
[im 97/131]
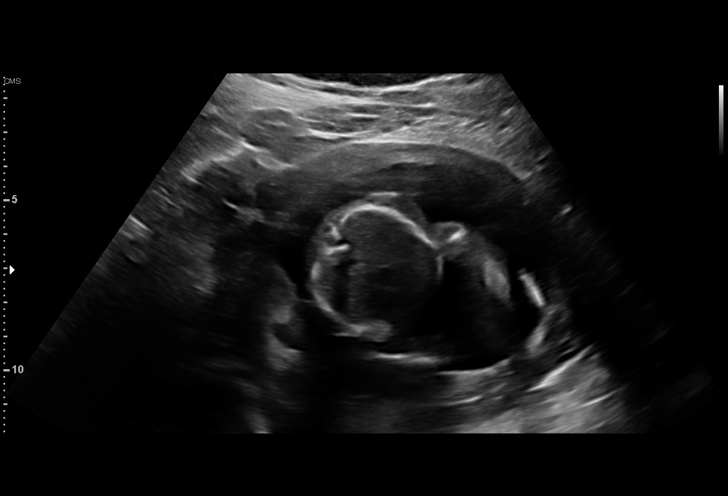
[im 106/131]
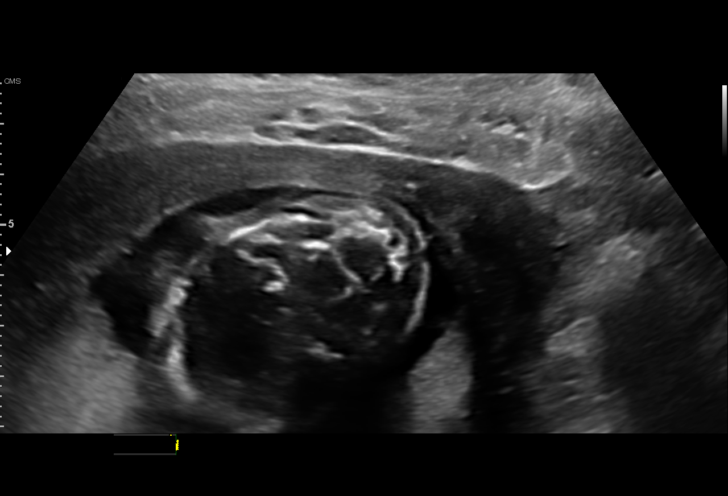
[im 116/131]
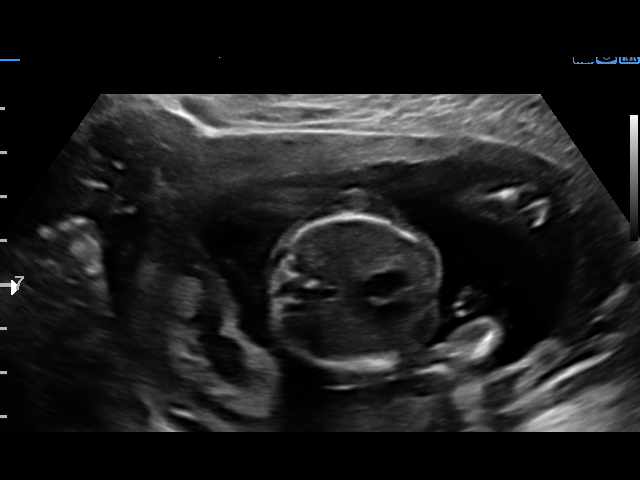
[im 126/131]
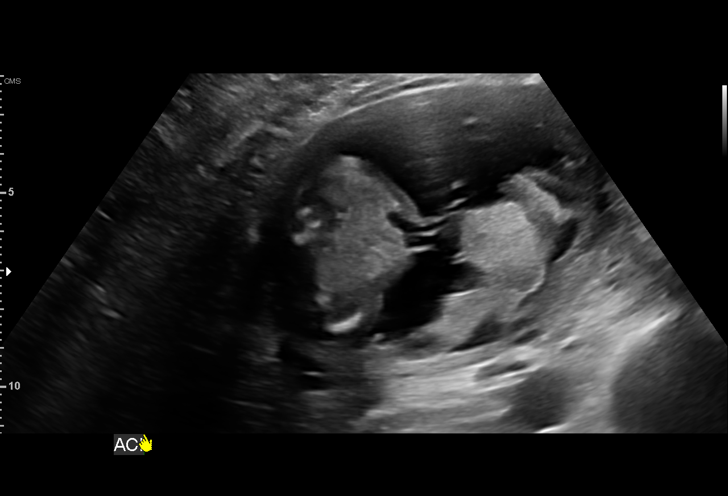

[13 of 28 positions shown; findings below may reference images not displayed]

1  US MFM OB DETAIL +14 WK               76811.01    CHIAW KUDUS

Indications

 19 weeks gestation of pregnancy
 Antenatal screening for malformations
 Obesity complicating pregnancy, second
 trimester (pregravid BMI 31)
 Low risk NIPS, neg AFP and Horizon 14
 Marginal insertion of umbilical cord affecting
 management of mother in second trimester
Fetal Evaluation

 Num Of Fetuses:         1
 Cardiac Activity:       Observed
 Presentation:           Variable
 Placenta:               Posterior
 P. Cord Insertion:      Marginal insertion

 Amniotic Fluid
 AFI FV:      Within normal limits

                             Largest Pocket(cm)

Biometry

 BPD:      40.4  mm     G. Age:  18w 2d         20  %    CI:        67.07   %    70 - 86
                                                         FL/HC:      15.8   %    16.1 -
 HC:       158   mm     G. Age:  18w 5d         27  %    HC/AC:      1.15        1.09 -
 AC:      137.1  mm     G. Age:  19w 1d         51  %    FL/BPD:     61.6   %
 FL:       24.9  mm     G. Age:  17w 4d          5  %    FL/AC:      18.2   %    20 - 24
 HUM:      25.9  mm     G. Age:  18w 1d         26  %
 CER:      18.7  mm     G. Age:  18w 3d         12  %
 NFT:       3.5  mm

 LV:        7.1  mm
 CM:        3.6  mm

 Est. FW:     238  gm      0 lb 8 oz     16  %
OB History

 Gravidity:    1         Term:   0        Prem:   0        SAB:   0
 TOP:          0       Ectopic:  0        Living: 0
Gestational Age

 LMP:           19w 4d        Date:  08/09/19                 EDD:   05/15/20
 U/S Today:     18w 3d                                        EDD:   05/23/20
 Best:          19w 0d     Det. By:  Early Ultrasound         EDD:   05/19/20
                                     (11/04/19)
Anatomy

 Cranium:               Appears normal         LVOT:                   Appears normal
 Cavum:                 Appears normal         Aortic Arch:            Appears normal
 Ventricles:            Appears normal         Ductal Arch:            Appears normal
 Choroid Plexus:        Appears normal         Diaphragm:              Appears normal
 Cerebellum:            Appears normal         Stomach:                Appears normal, left
                                                                       sided
 Posterior Fossa:       Appears normal         Abdominal Wall:         Appears nml (cord
                                                                       insert, abd wall)
 Nuchal Fold:           Appears normal         Cord Vessels:           Appears normal (3
                                                                       vessel cord)
 Face:                  Orbits nl; profile not Kidneys:                Appear normal
                        well visualized
 Lips:                  Appears normal         Bladder:                Appears normal
 Palate:                Appears normal         Spine:                  Appears normal
 Thoracic:              Appears normal         Upper Extremities:      Appears normal
 Heart:                 Not well visualized    Lower Extremities:      Appears normal
 RVOT:                  Appears normal

 Other:  Fetus appears to be a male. Heels/feet and open hands/5th digits
         visualized. VC, 3VV and 3VTV visualized. Nasal bone visualized.
Doppler - Fetal Vessels

 Umbilical Artery
  S/D     %tile      RI    %tile                             ADFV    RDFV
  4.33       52    0.77       50                                No      No

Cervix Uterus Adnexa

 Cervix
 Length:           3.37  cm.
 Normal appearance by transabdominal scan.

 Uterus
 No abnormality visualized.
 Right Ovary
 Within normal limits.

 Left Ovary
 Within normal limits.

 Cul De Sac
 No free fluid seen.

 Adnexa
 No abnormality visualized.
Impression

 G1 P0. Patient is here for fetal anatomy scan. She is sure of
 her LMP date but reports she gets. Is every 2 months.

 On cell-free fetal DNA screening, the risks of fetal
 aneuploidies are not increased .MSAFP screening showed
 low risk for open-neural tube defects .

 We performed fetal anatomy scan. No makers of
 aneuploidies or fetal structural defects are seen. Fetal
 biometry is consistent with her previously-established dates.
 Amniotic fluid is normal and good fetal activity is seen.
 Marginal cord insertion is seen. I explained the finding that
 marginal cord insertion is not usually associated with fetal
 adverse outcomes. However, in some cases, it can be
 associated with fetal growth restriction. We recommend serial
 fetal growth assessments till delivery.
 Patient understands the limitations of ultrasound in detecting
 fetal anomalies.

 We have assigned her EDD at 05/19/2020 based on
 ultrasound performed at 9 weeks gestation.
Recommendations

 -An appointment was made for her to return in 4 weeks for
 completion of fetal anatomy.
                 Sanskrit, S T Shakil

## 2022-03-12 IMAGING — US US MFM OB FOLLOW-UP
1 series · 14 of 28 positions shown · non-contrast
Comparison: none

[Series 1: us mfm ob follow-up · 69 acquisitions, 14 frames shown]
[im 3/69]
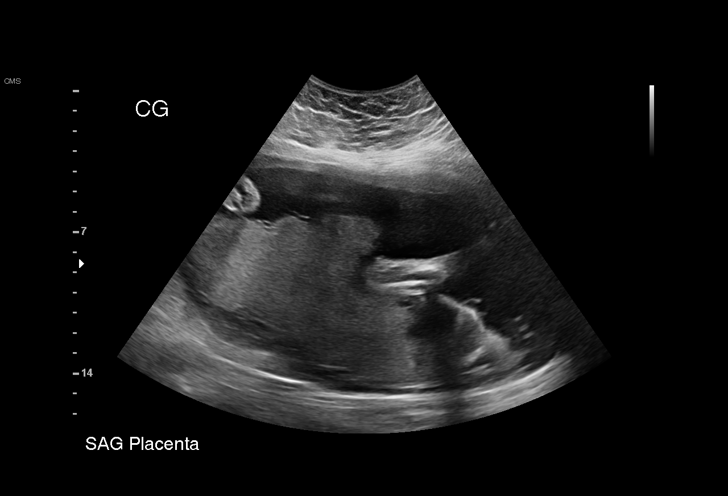
[im 8/69]
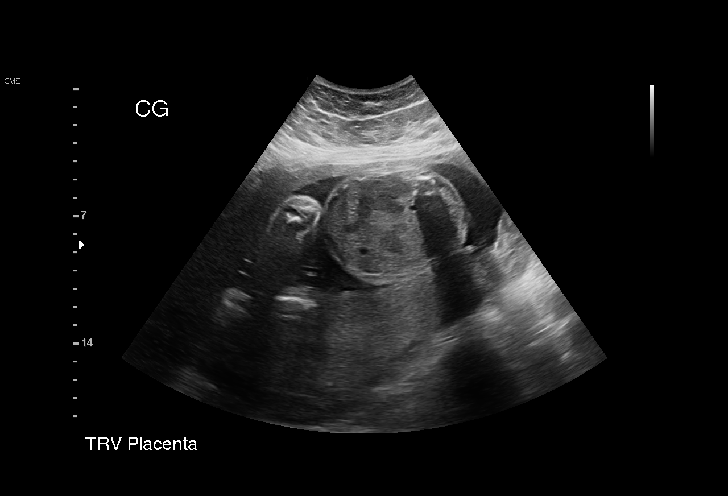
[im 13/69]
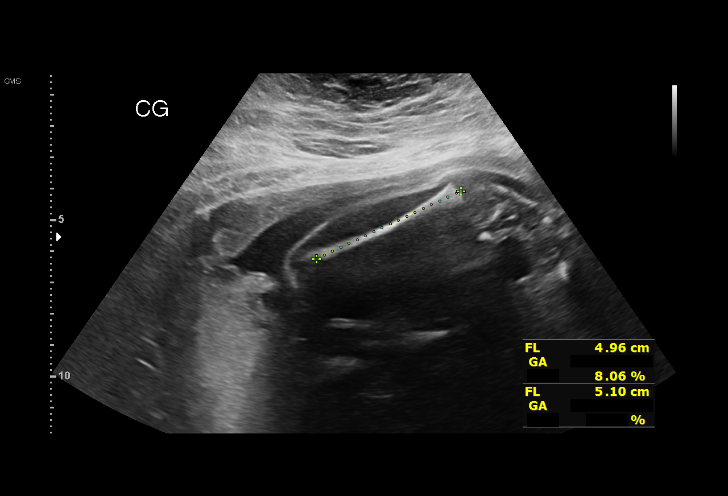
[im 18/69]
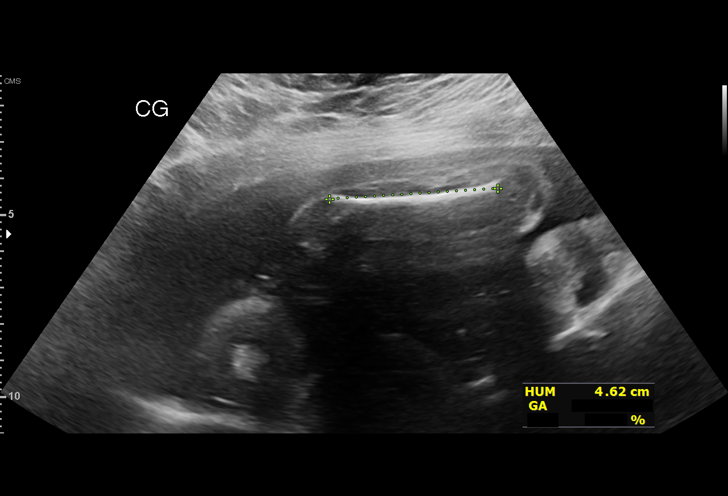
[im 23/69]
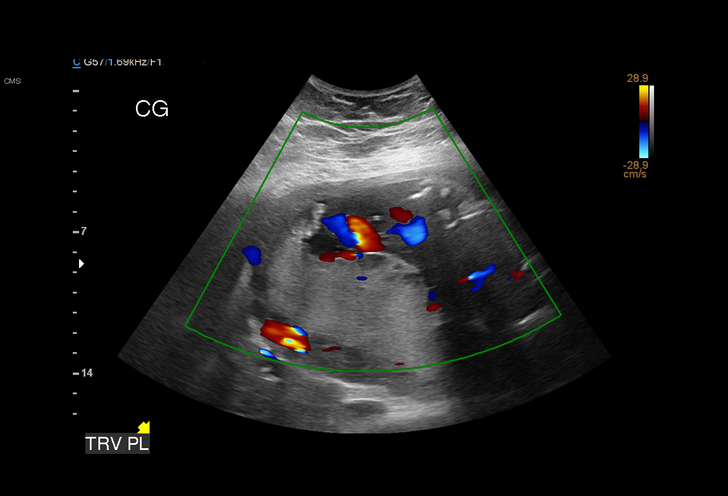
[im 28/69]
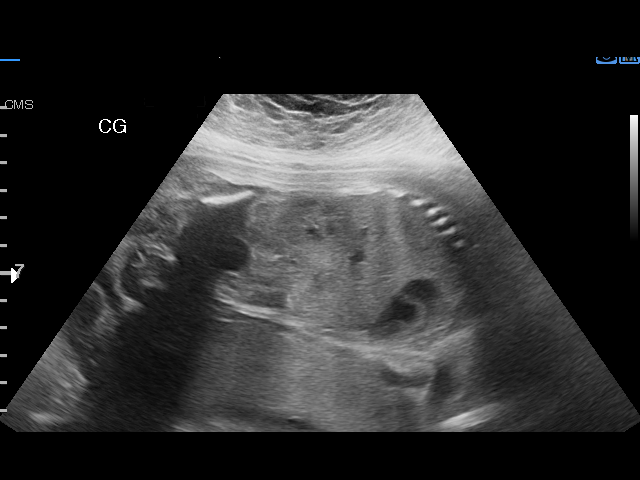
[im 33/69]
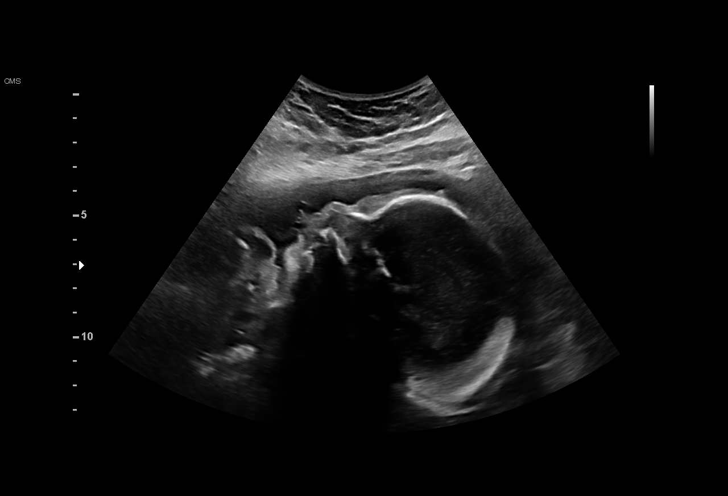
[im 38/69]
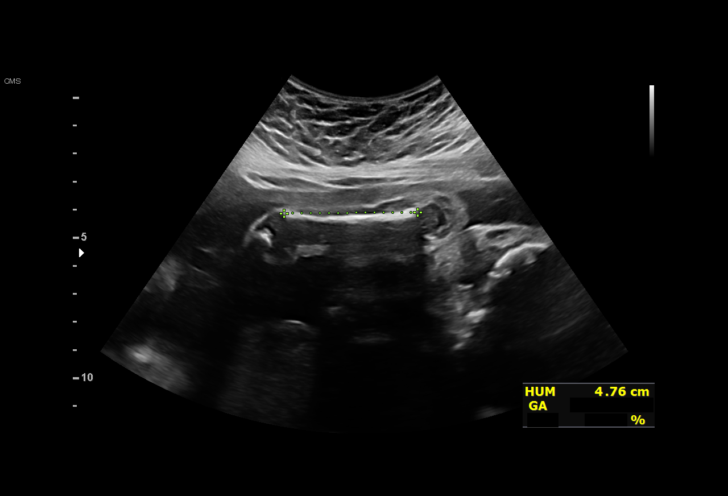
[im 43/69]
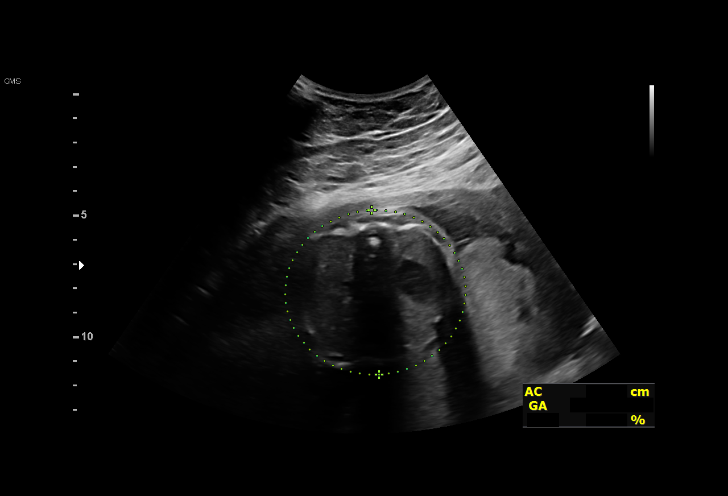
[im 48/69]
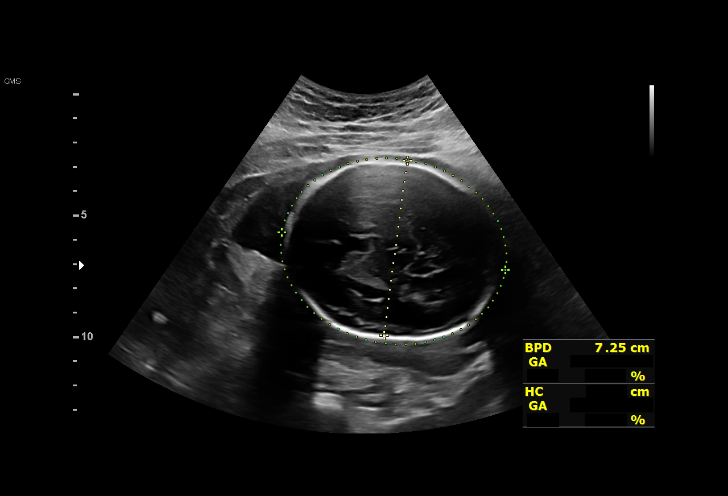
[im 53/69]
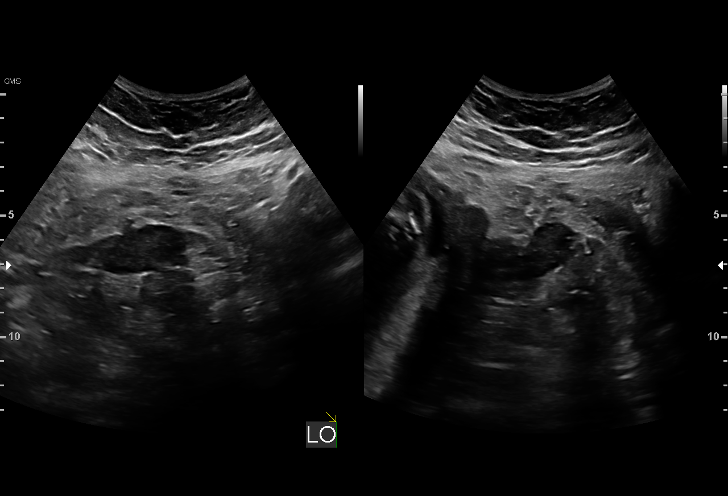
[im 58/69]
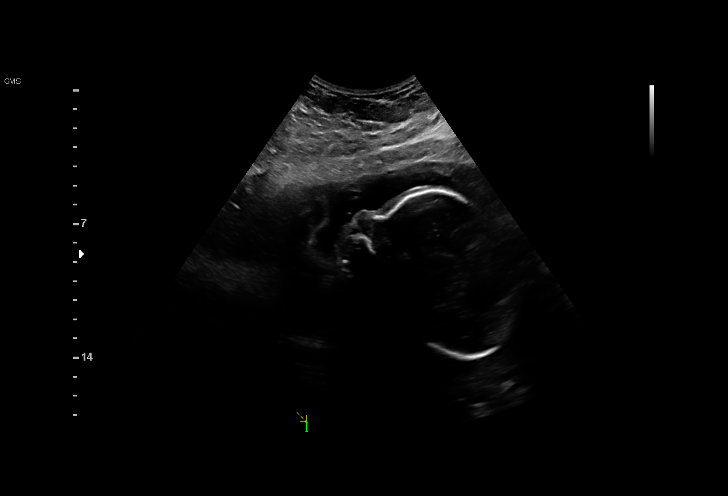
[im 63/69]
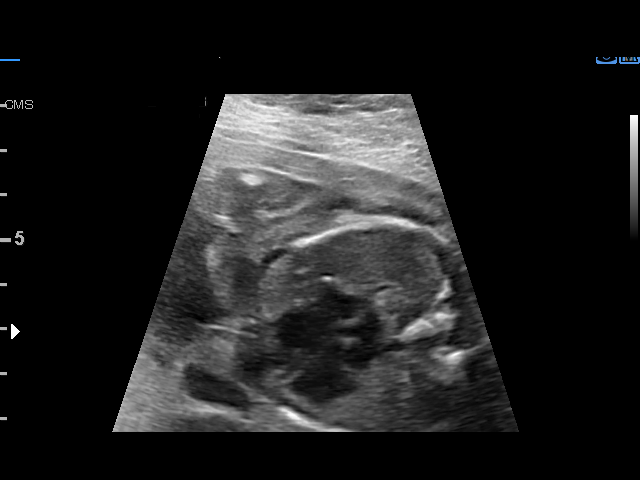
[im 69/69]
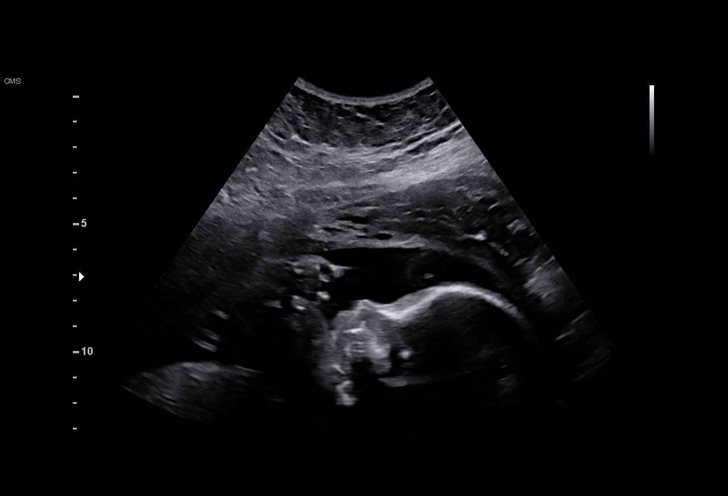

[14 of 28 positions shown; findings below may reference images not displayed]

OVERTAKE

Indications

 Marginal insertion of umbilical cord affecting
 management of mother in third trimester
 Obesity complicating pregnancy, third
 trimester (pregravid BMI 31)
 Encounter for other antenatal screening
 follow-up
 Low risk NIPS, neg AFP and Horizon 14
 28 weeks gestation of pregnancy
Fetal Evaluation

 Num Of Fetuses:         1
 Fetal Heart Rate(bpm):  150
 Cardiac Activity:       Observed
 Presentation:           Cephalic
 Placenta:               Posterior
 P. Cord Insertion:      Marginal insertion prev vis

 Amniotic Fluid
 AFI FV:      Within normal limits

 AFI Sum(cm)     %Tile       Largest Pocket(cm)
 13              36

 RUQ(cm)       RLQ(cm)       LUQ(cm)        LLQ(cm)

Biometry

 BPD:      72.6  mm     G. Age:  29w 1d         75  %    CI:        74.59   %    70 - 86
                                                         FL/HC:      18.7   %    18.8 -
 HC:      266.8  mm     G. Age:  29w 0d         53  %    HC/AC:      1.18        1.05 -
 AC:      225.3  mm     G. Age:  27w 0d         14  %    FL/BPD:     68.6   %    71 - 87
 FL:       49.8  mm     G. Age:  26w 6d          9  %    FL/AC:      22.1   %    20 - 24
 HUM:      46.9  mm     G. Age:  27w 4d         38  %

 LV:          4  mm

 Est. FW:    4600  gm      2 lb 5 oz     14  %
OB History

 Gravidity:    1         Term:   0        Prem:   0        SAB:   0
 TOP:          0       Ectopic:  0        Living: 0
Gestational Age

 LMP:           28w 4d        Date:  08/09/19                 EDD:   05/15/20
 U/S Today:     28w 0d                                        EDD:   05/19/20
 Best:          28w 0d     Det. By:  Early Ultrasound         EDD:   05/19/20
                                     (11/04/19)
Anatomy

 Cranium:               Appears normal         LVOT:                   Previously seen
 Cavum:                 Appears normal         Aortic Arch:            Previously seen
 Ventricles:            Appears normal         Ductal Arch:            Previously seen
 Choroid Plexus:        Previously seen        Diaphragm:              Appears normal
 Cerebellum:            Previously seen        Stomach:                Appears normal, left
                                                                       sided
 Posterior Fossa:       Previously seen        Abdomen:                Appears normal
 Nuchal Fold:           Previously seen        Abdominal Wall:         Previously seen
 Face:                  Appears normal         Cord Vessels:           Previously seen
                        (orbits and profile)
 Lips:                  Appears normal         Kidneys:                Appear normal
 Palate:                Previously seen        Bladder:                Appears normal
 Thoracic:              Appears normal         Spine:                  Previously seen
 Heart:                 Previously seen        Upper Extremities:      Previously seen
 RVOT:                  Previously seen        Lower Extremities:      Previously seen

 Other:  . Heels/feet, Open hands/5th previously digits, VC, 3VV, 3VTV, and
         Nasal bone previously visualized.
Cervix Uterus Adnexa

 Cervix
 Not visualized (advanced GA >57wks)

 Uterus
 No abnormality visualized.

 Right Ovary
 Within normal limits.
 Left Ovary
 Within normal limits.

 Cul De Sac
 No free fluid seen.

 Adnexa
 No abnormality visualized.
Comments

 This patient was seen for a follow up growth scan due to a
 marginal placental cord insertion that was noted during her
 prior exams.  She denies any problems since her last exam.
 The fetal growth continues to measure in the lower normal
 range (14th percentile).  This growth profile is consistent with
 her prior ultrasound exams.  There was normal amniotic fluid
 noted today.
 Due to the marginal placental cord insertion, a follow-up
 growth scan was scheduled in 4 weeks.

## 2022-05-25 IMAGING — US US MFM FETAL BPP W/O NON-STRESS
1 series · 15 of 28 positions shown · non-contrast
Comparison: none

[Series 1: us mfm fetal bpp w/o non-stress · 30 acquisitions, 15 frames shown]
[im 1/30]
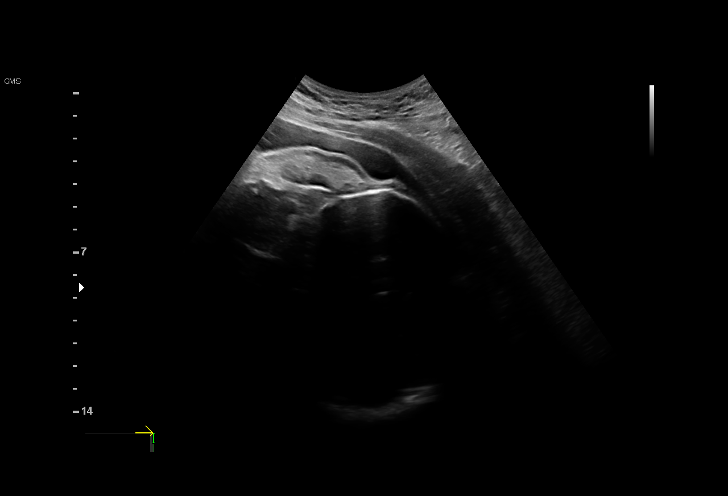
[im 3/30]
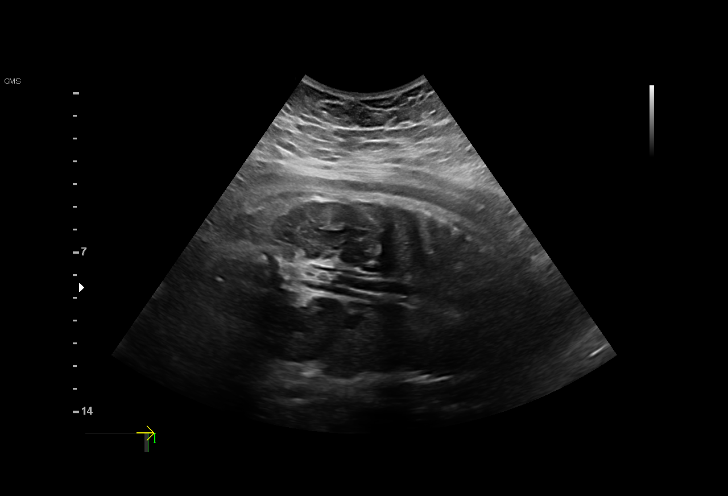
[im 5/30]
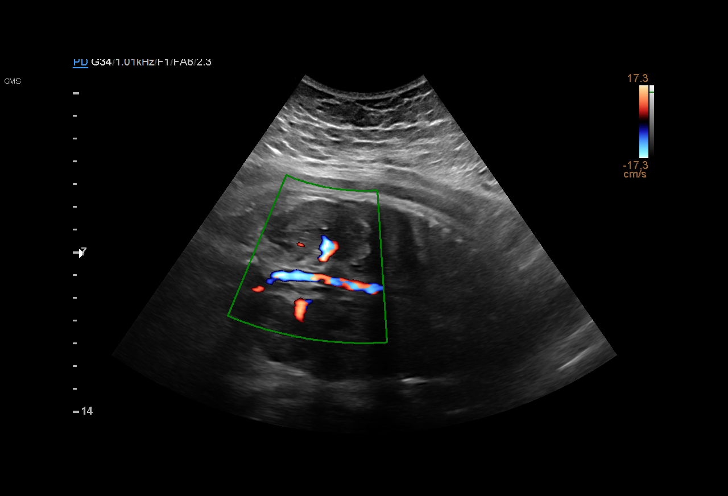
[im 7/30]
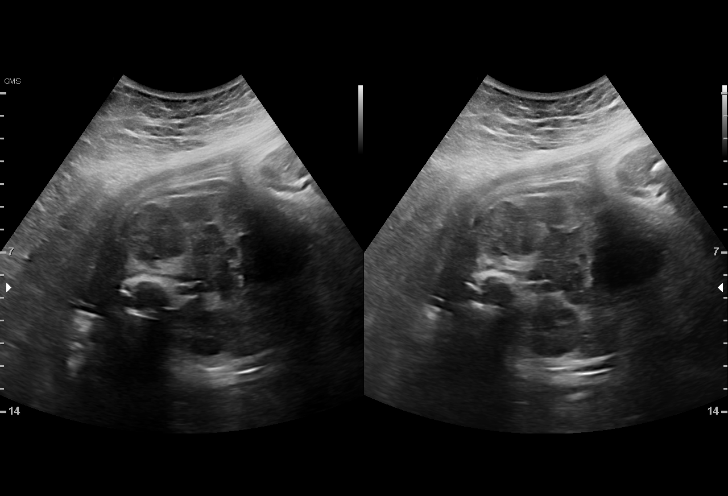
[im 9/30]
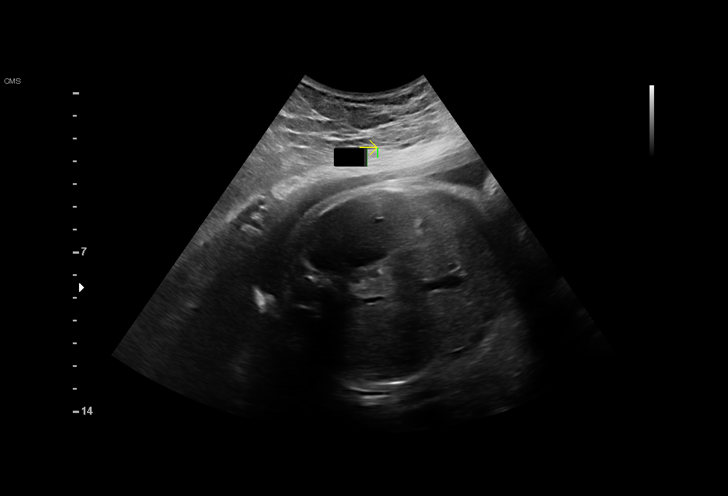
[im 11/30]
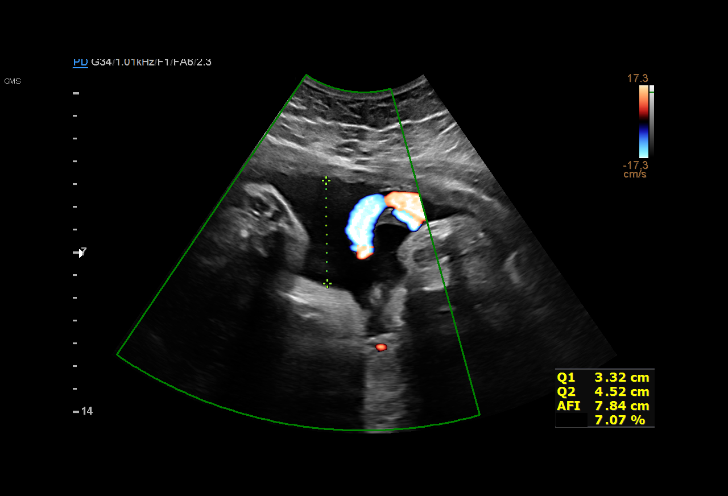
[im 13/30]
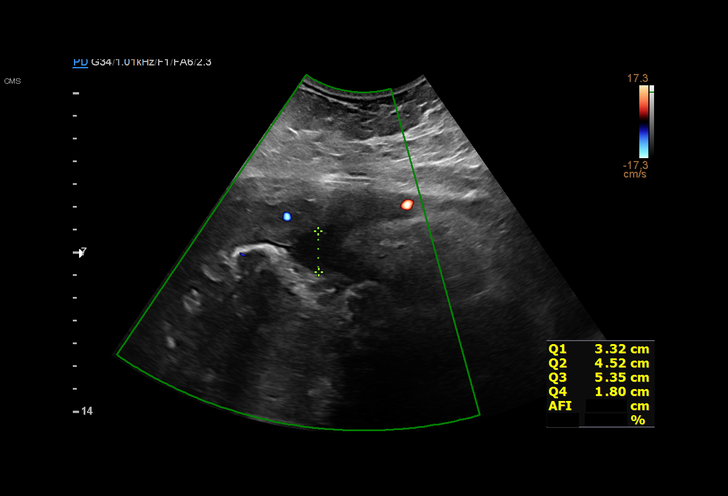
[im 16/30]
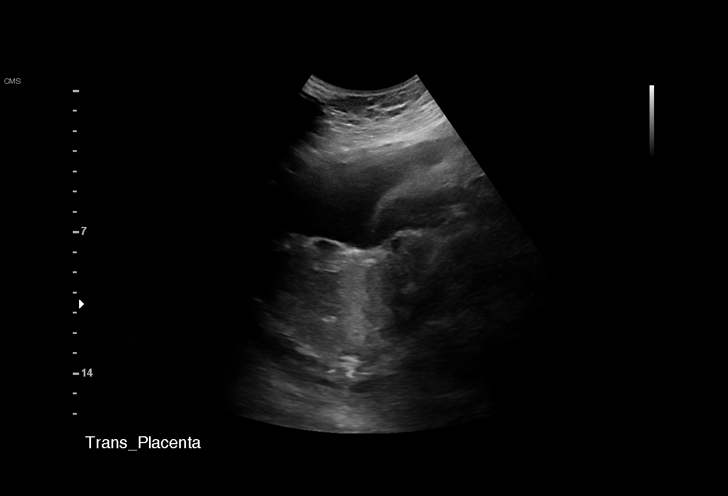
[im 17/30]
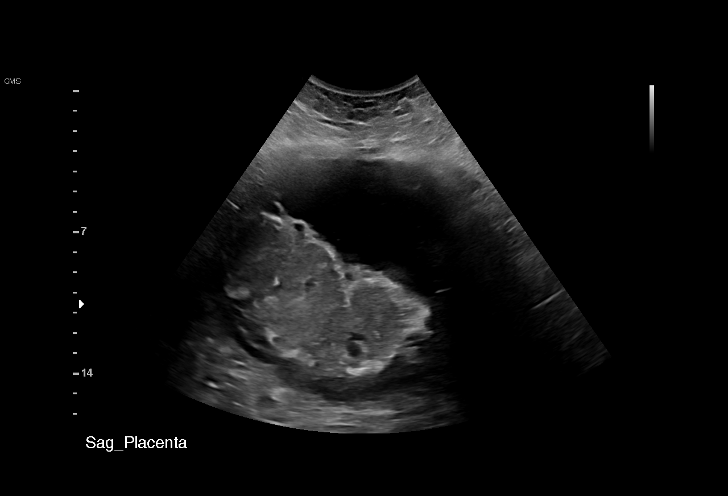
[im 19/30]
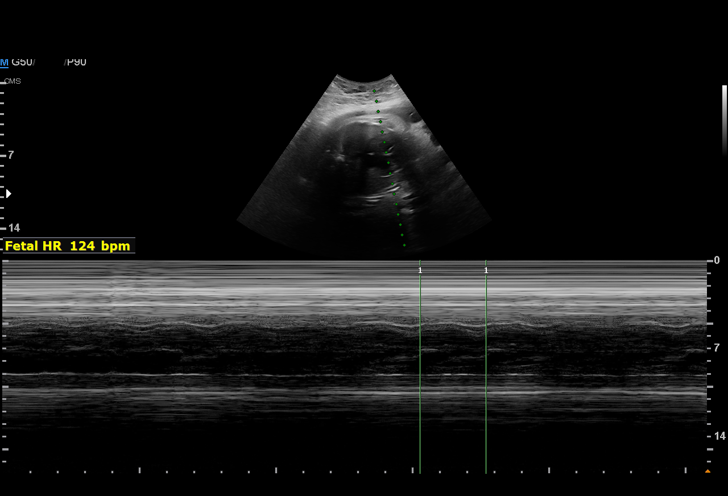
[im 21/30]
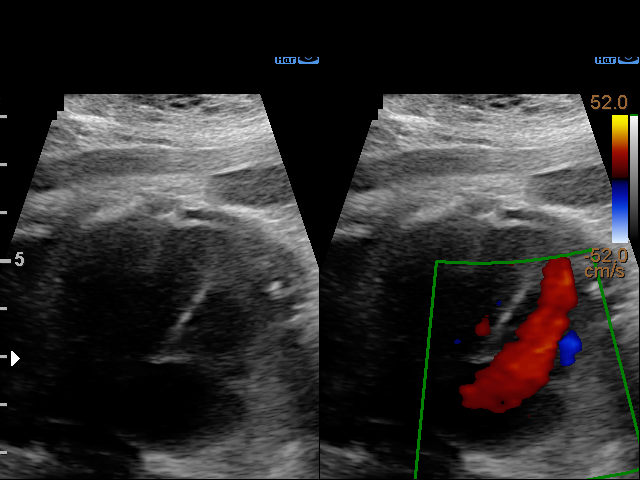
[im 23/30]
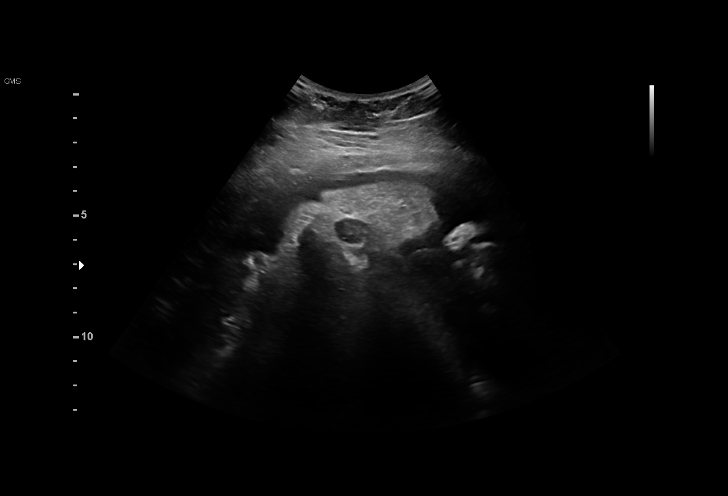
[im 25/30]
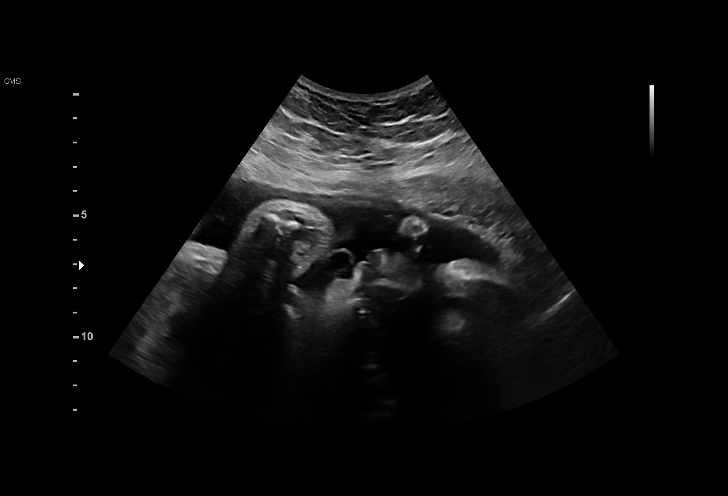
[im 27/30]
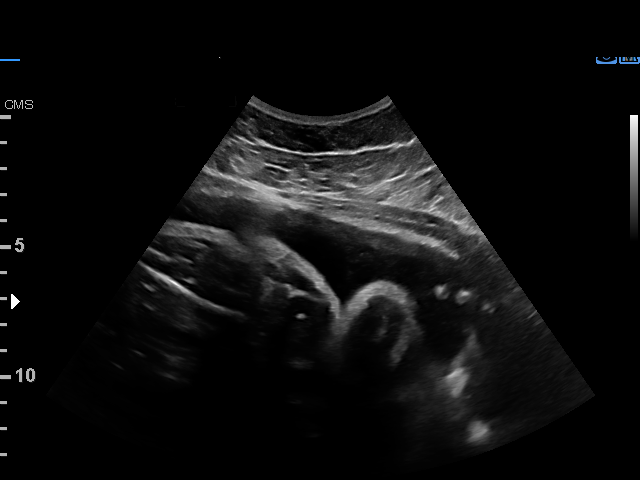
[im 30/30]
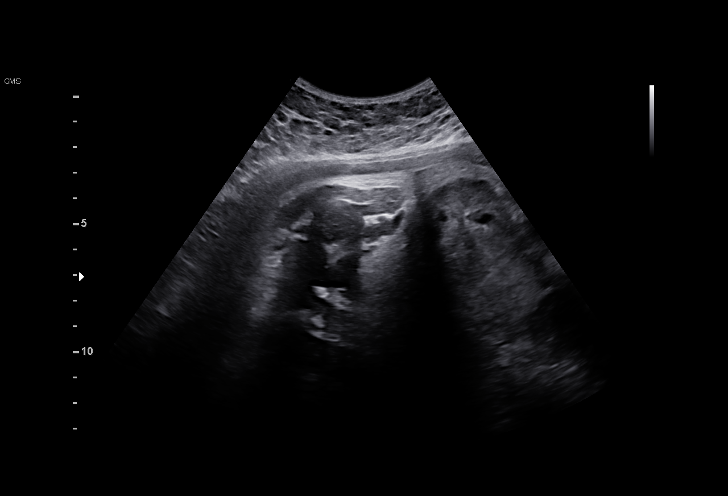

[15 of 28 positions shown; findings below may reference images not displayed]

Indications

 38 weeks gestation of pregnancy
 Decreased fetal movement
 Marginal insertion of umbilical cord affecting
 management of mother in third trimester
 Obesity complicating pregnancy, third
 trimester (pregravid BMI 31)
 Oligohydramnios / Decreased amniotic fluid
 volume (suspected)
 Low risk NIPS, neg AFP and Horizon 14
Fetal Evaluation

 Num Of Fetuses:         1
 Fetal Heart Rate(bpm):  124
 Cardiac Activity:       Observed
 Presentation:           Cephalic
 Placenta:               Posterior
 P. Cord Insertion:      Previously Visualized

 Amniotic Fluid
 AFI FV:      Within normal limits

 AFI Sum(cm)     %Tile       Largest Pocket(cm)
 15              59

 RUQ(cm)       RLQ(cm)       LUQ(cm)        LLQ(cm)

Biophysical Evaluation

 Amniotic F.V:   Within normal limits       F. Tone:        Observed
 F. Movement:    Not Observed               Score:          [DATE]
 F. Breathing:   Not Observed
OB History

 Gravidity:    1         Term:   0        Prem:   0        SAB:   0
 TOP:          0       Ectopic:  0        Living: 0
Gestational Age

 LMP:           39w 1d        Date:  08/09/19                 EDD:   05/15/20
 Best:          38w 4d     Det. By:  Early Ultrasound         EDD:   05/19/20
                                     (11/04/19)
Anatomy

 Stomach:               Appears normal, left   Bladder:                Appears normal
                        sided
 Kidneys:               Appear normal
Impression

 Antenatal testing performed given maternal [id number]
 The biophysical profile was [DATE] (-2 for breathing and
 movement). There was normal amniotic fluid volume.

 Ms. Hameish reports experiencing decreased fetal movement.

 I reviewed with Ms. Rungloll Koenig exam and recommened
 further monitoring on NST. If NST is non-reactive or non-
 reassuring consider delivery. If reactive consider repeat BPP
 in 4 hours. If BPP is less than [DATE] consider delivery.

 I discussed this plan with Dr. Jarod.
Recommendations

 To DESMARAIS
